# Patient Record
Sex: Female | Born: 1980 | Race: Black or African American | Hispanic: No | Marital: Single | Smoking: Never smoker
Health system: Southern US, Community
[De-identification: ages and names within clinical notes are randomized; demographics above are authoritative.]

## PROBLEM LIST (undated history)

## (undated) DIAGNOSIS — R519 Headache, unspecified: Secondary | ICD-10-CM

## (undated) DIAGNOSIS — D649 Anemia, unspecified: Secondary | ICD-10-CM

## (undated) HISTORY — PX: TONSILLECTOMY AND ADENOIDECTOMY: SHX28

---

## 2014-07-29 ENCOUNTER — Emergency Department (INDEPENDENT_AMBULATORY_CARE_PROVIDER_SITE_OTHER)
Admission: EM | Admit: 2014-07-29 | Discharge: 2014-07-29 | Disposition: A | Discharge status: Home / Self Care | Payer: Self-pay | Source: Home / Self Care

## 2014-07-29 ENCOUNTER — Encounter (HOSPITAL_COMMUNITY): Payer: Self-pay | Admitting: Emergency Medicine

## 2014-07-29 DIAGNOSIS — T2157XA Corrosion of first degree of female genital region, initial encounter: Secondary | ICD-10-CM

## 2014-07-29 DIAGNOSIS — IMO0002 Reserved for concepts with insufficient information to code with codable children: Secondary | ICD-10-CM

## 2014-07-29 HISTORY — DX: Anemia, unspecified: D64.9

## 2014-07-29 MED ORDER — HYDROCORTISONE 1 % EX OINT: 1.0000 "application " | TOPICAL_OINTMENT | Freq: Two times a day (BID) | CUTANEOUS | Status: DC
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
## 2014-07-29 NOTE — ED Notes (Signed)
Patient c/o burn/irritation to her vaginal area onset Monday. Patient reports she used tea  Tree oil mixed with apple cider vinegar on a guaze to treated vaginal odor she often gets before menstruating. Patient reports she left it on for about an hour and immediatly felt burning. Patient is alert and oriented and in NAD.

## 2014-07-29 NOTE — Discharge Instructions (Signed)
Thank you for coming in today. Apply hydrocortisone ointment to the vagina twice daily until the skin feels better.  Come back as needed.   Chemical Burn Many chemicals can burn the skin. A chemical burn should be flushed with cool water and checked by an emergency caregiver. Your skin is a natural barrier to infection. It is the largest organ of your body. Burns damage this natural protection. To help prevent infection, it is very important to follow your caregiver's instructions in the care of your burn.  Many industrial chemicals may cause burns. These chemicals include acids, alkalis, and organic compounds such as petroleum, phenol, bitumen, tar, and grease. When acids come in contact with the skin, they cause an immediate change in the skin.Acid burns produce significant pain and form a scab (eschar). Usually, the immediate skin changes are the only damage from an acid burn.However, exposure to formic acid, chromic acid, or hydrofluoric acid may affect the whole body and may even be life-threatening. Alkalis include lye, cement, lime, and many chemicals with "hydroxide" in their name.An alkali burn may be less apparent than an acid burn at first. However, alkalis may cause greater tissue damage.It is important to be aware of any chemicals you are using. Treat any exposure to skin, eyes, or mucous membranes (nose, mouth, throat) as a potential emergency. PREVENTION  Avoid exposure to toxic chemicals that can cause burns.  Store chemicals out of the reach of children.  Use protective gloves when handling dangerous chemicals. HOME CARE INSTRUCTIONS   Wash your hands well before changing your bandage.  Change your bandage as often as directed by your caregiver.  Remove the old bandage. If the bandage sticks, you may soak it off with cool, clean water.  Cleanse the burn thoroughly but gently with mild soap and water.  Pat the area dry with a clean, dry cloth.  Apply a thin layer of  antibacterial cream to the burn.  Apply a clean bandage as instructed by your caregiver.  Keep the bandage as clean and dry as possible.  Elevate the affected area for the first 24 hours, then as instructed by your caregiver.  Only take over-the-counter or prescription medicines for pain, discomfort, or fever as directed by your caregiver.  Keep all follow-up appointments.This is important. This is how your caregiver can tell if your treatment is working. SEEK IMMEDIATE MEDICAL CARE IF:   You develop excessive pain.  You develop redness, tenderness, swelling, or red streaks near the burn.  The burned area develops yellowish-white fluid (pus) or a bad smell.  You have a fever. MAKE SURE YOU:   Understand these instructions.  Will watch your condition.  Will get help right away if you are not doing well or get worse. Document Released: 08/05/2004 Document Revised: 01/22/2012 Document Reviewed: 03/27/2011 [facility] Patient Information 2015 [place], [place]. This information is not intended to replace advice given to you by your health care provider. Make sure you discuss any questions you have with your health care provider.

## 2015-01-25 ENCOUNTER — Emergency Department (INDEPENDENT_AMBULATORY_CARE_PROVIDER_SITE_OTHER)
Admission: EM | Admit: 2015-01-25 | Discharge: 2015-01-25 | Disposition: A | Discharge status: Home / Self Care | Payer: PRIVATE HEALTH INSURANCE | Source: Home / Self Care | Attending: Family Medicine | Admitting: Family Medicine

## 2015-01-25 ENCOUNTER — Encounter (HOSPITAL_COMMUNITY): Payer: Self-pay

## 2015-01-25 DIAGNOSIS — J069 Acute upper respiratory infection, unspecified: Secondary | ICD-10-CM

## 2015-01-25 MED ORDER — BENZONATATE 100 MG PO CAPS: 100.0000 mg | ORAL_CAPSULE | Freq: Three times a day (TID) | ORAL | Status: DC | PRN

## 2015-01-25 MED ORDER — IPRATROPIUM BROMIDE 0.06 % NA SOLN: 2.0000 | Freq: Four times a day (QID) | NASAL | Status: DC

## 2015-01-25 NOTE — ED Provider Notes (Signed)
CSN: [id number]     Arrival date & time 01/25/15  0940 History   First MD Initiated Contact with Patient 01/25/15 1037     Chief Complaint  Patient presents with  . Facial Pain   (Consider location/radiation/quality/duration/timing/severity/associated sxs/prior Treatment) HPI Comments: No fever Nonsmoker Began taking Sudafed today No flu shot this season PCP: none  Patient is a 34 y.o. female presenting with URI. The history is provided by the patient.  URI Presenting symptoms: congestion and cough   Presenting symptoms: no ear pain, no facial pain, no fatigue, no fever, no rhinorrhea and no sore throat   Severity:  Mild Onset quality:  Gradual Duration:  5 days Timing:  Constant Progression:  Unchanged Chronicity:  New Associated symptoms: headaches   Associated symptoms: no myalgias, no neck pain, no sinus pain, no sneezing, no swollen glands and no wheezing   Associated symptoms comment:  Sinus pressure   Past Medical History  Diagnosis Date  . Anemia    Past Surgical History  Procedure Laterality Date  . Tonsillectomy and adenoidectomy     History reviewed. No pertinent family history. History  Substance Use Topics  . Smoking status: Never Smoker   . Smokeless tobacco: Not on file  . Alcohol Use: Yes   OB History    No data available     Review of Systems  Constitutional: Negative for fever and fatigue.  HENT: Positive for congestion and sinus pressure. Negative for ear pain, hearing loss, mouth sores, nosebleeds, postnasal drip, rhinorrhea, sneezing and sore throat.   Eyes: Negative.   Respiratory: Positive for cough. Negative for chest tightness, shortness of breath and wheezing.   Cardiovascular: Negative.   Gastrointestinal: Negative.   Genitourinary: Negative.   Musculoskeletal: Negative for myalgias, back pain and neck pain.  Skin: Negative.   Neurological: Positive for headaches.    Allergies  Review of patient's allergies indicates no known  allergies.  Home Medications   Prior to Admission medications   Medication Sig Start Date End Date Taking? Authorizing Provider  benzonatate (TESSALON) 100 MG capsule Take 1 capsule (100 mg total) by mouth 3 (three) times daily as needed for cough. 01/25/15   Audelia Hives Kapil Petropoulos, PA  hydrocortisone 1 % ointment Apply 1 application topically 2 (two) times daily. 07/29/14   Gregor Hams, MD  ipratropium (ATROVENT) 0.06 % nasal spray Place 2 sprays into both nostrils 4 (four) times daily. 01/25/15   Audelia Hives Jamani Bearce, PA   BP 119/79 mmHg  Pulse 74  Temp(Src) 98.6 F (37 C) (Oral)  Resp 14  SpO2 96% Physical Exam  Constitutional: She is oriented to person, place, and time. She appears well-developed and well-nourished. No distress.  HENT:  Head: Normocephalic and atraumatic.  Right Ear: Hearing, external ear and ear canal normal. No tenderness. No mastoid tenderness. Tympanic membrane is injected. Tympanic membrane is not erythematous, not retracted and not bulging. No middle ear effusion.  Left Ear: Hearing, external ear and ear canal normal. No tenderness. No mastoid tenderness. Tympanic membrane is not injected, not erythematous, not retracted and not bulging.  No middle ear effusion.  Nose: Mucosal edema present.  Mouth/Throat: Uvula is midline, oropharynx is clear and moist and mucous membranes are normal.  Eyes: Conjunctivae are normal. No scleral icterus.  Neck: Normal range of motion. Neck supple.  Cardiovascular: Normal rate, regular rhythm and normal heart sounds.   Pulmonary/Chest: Effort normal and breath sounds normal. No respiratory distress. She has no wheezes.  Musculoskeletal: Normal range of motion.  Lymphadenopathy:    She has no cervical adenopathy.  Neurological: She is alert and oriented to person, place, and time.  Skin: Skin is warm and dry.  Psychiatric: She has a normal mood and affect. Her behavior is normal.  Nursing note and vitals reviewed.   ED  Course  Procedures (including critical care time) Labs Review Labs Reviewed - No data to display  Imaging Review No results found.   MDM   1. URI (upper respiratory infection)   Atrovent nasal spray Tessalon Continue Sudafed as directed on packaging.  Expect improvement over the next few days    Lutricia Feil, PA 01/25/15 1114

## 2015-01-25 NOTE — ED Notes (Signed)
C/o pain and pressure in face since 3-8. Minimal relief w home treatment. C/o green/yellow secretions

## 2015-01-25 NOTE — Discharge Instructions (Signed)
Please continue using Sudafed as directed on packaging in addition to those medications that have been prescribed here. Drink plenty of fluids. Tylenol or ibuprofen as directed on packaging for pain.  Upper Respiratory Infection, Adult An upper respiratory infection (URI) is also sometimes known as the common cold. The upper respiratory tract includes the nose, sinuses, throat, trachea, and bronchi. Bronchi are the airways leading to the lungs. Most people improve within 1 week, but symptoms can last up to 2 weeks. A residual cough may last even longer.  CAUSES Many different viruses can infect the tissues lining the upper respiratory tract. The tissues become irritated and inflamed and often become very moist. Mucus production is also common. A cold is contagious. You can easily spread the virus to others by oral contact. This includes kissing, sharing a glass, coughing, or sneezing. Touching your mouth or nose and then touching a surface, which is then touched by another person, can also spread the virus. SYMPTOMS  Symptoms typically develop 1 to 3 days after you come in contact with a cold virus. Symptoms vary from person to person. They may include:  Runny nose.  Sneezing.  Nasal congestion.  Sinus irritation.  Sore throat.  Loss of voice (laryngitis).  Cough.  Fatigue.  Muscle aches.  Loss of appetite.  Headache.  Low-grade fever. DIAGNOSIS  You might diagnose your own cold based on familiar symptoms, since most people get a cold 2 to 3 times a year. Your caregiver can confirm this based on your exam. Most importantly, your caregiver can check that your symptoms are not due to another disease such as strep throat, sinusitis, pneumonia, asthma, or epiglottitis. Blood tests, throat tests, and X-rays are not necessary to diagnose a common cold, but they may sometimes be helpful in excluding other more serious diseases. Your caregiver will decide if any further tests are  required. RISKS AND COMPLICATIONS  You may be at risk for a more severe case of the common cold if you smoke cigarettes, have chronic heart disease (such as heart failure) or lung disease (such as asthma), or if you have a weakened immune system. The very young and very old are also at risk for more serious infections. Bacterial sinusitis, middle ear infections, and bacterial pneumonia can complicate the common cold. The common cold can worsen asthma and chronic obstructive pulmonary disease (COPD). Sometimes, these complications can require emergency medical care and may be life-threatening. PREVENTION  The best way to protect against getting a cold is to practice good hygiene. Avoid oral or hand contact with people with cold symptoms. Wash your hands often if contact occurs. There is no clear evidence that vitamin C, vitamin E, echinacea, or exercise reduces the chance of developing a cold. However, it is always recommended to get plenty of rest and practice good nutrition. TREATMENT  Treatment is directed at relieving symptoms. There is no cure. Antibiotics are not effective, because the infection is caused by a virus, not by bacteria. Treatment may include:  Increased fluid intake. Sports drinks offer valuable electrolytes, sugars, and fluids.  Breathing heated mist or steam (vaporizer or shower).  Eating chicken soup or other clear broths, and maintaining good nutrition.  Getting plenty of rest.  Using gargles or lozenges for comfort.  Controlling fevers with ibuprofen or acetaminophen as directed by your caregiver.  Increasing usage of your inhaler if you have asthma. Zinc gel and zinc lozenges, taken in the first 24 hours of the common cold, can shorten the duration  and lessen the severity of symptoms. Pain medicines may help with fever, muscle aches, and throat pain. A variety of non-prescription medicines are available to treat congestion and runny nose. Your caregiver can make  recommendations and may suggest nasal or lung inhalers for other symptoms.  HOME CARE INSTRUCTIONS   Only take over-the-counter or prescription medicines for pain, discomfort, or fever as directed by your caregiver.  Use a warm mist humidifier or inhale steam from a shower to increase air moisture. This may keep secretions moist and make it easier to breathe.  Drink enough water and fluids to keep your urine clear or pale yellow.  Rest as needed.  Return to work when your temperature has returned to normal or as your caregiver advises. You may need to stay home longer to avoid infecting others. You can also use a face mask and careful hand washing to prevent spread of the virus. SEEK MEDICAL CARE IF:   After the first few days, you feel you are getting worse rather than better.  You need your caregiver's advice about medicines to control symptoms.  You develop chills, worsening shortness of breath, or brown or red sputum. These may be signs of pneumonia.  You develop yellow or brown nasal discharge or pain in the face, especially when you bend forward. These may be signs of sinusitis.  You develop a fever, swollen neck glands, pain with swallowing, or white areas in the back of your throat. These may be signs of strep throat. SEEK IMMEDIATE MEDICAL CARE IF:   You have a fever.  You develop severe or persistent headache, ear pain, sinus pain, or chest pain.  You develop wheezing, a prolonged cough, cough up blood, or have a change in your usual mucus (if you have chronic lung disease).  You develop sore muscles or a stiff neck. Document Released: 04/25/2001 Document Revised: 01/22/2012 Document Reviewed: 02/04/2014 [facility] Patient Information 2015 [place], [place]. This information is not intended to replace advice given to you by your health care provider. Make sure you discuss any questions you have with your health care provider.

## 2015-02-09 ENCOUNTER — Other Ambulatory Visit (HOSPITAL_COMMUNITY)
Admission: RE | Admit: 2015-02-09 | Discharge: 2015-02-09 | Disposition: A | Discharge status: Home / Self Care | Payer: PRIVATE HEALTH INSURANCE | Source: Ambulatory Visit | Attending: Nurse Practitioner | Admitting: Nurse Practitioner

## 2015-02-09 ENCOUNTER — Other Ambulatory Visit: Payer: Self-pay | Admitting: Nurse Practitioner

## 2015-02-09 DIAGNOSIS — Z01419 Encounter for gynecological examination (general) (routine) without abnormal findings: Secondary | ICD-10-CM | POA: Diagnosis not present

## 2015-02-09 DIAGNOSIS — Z113 Encounter for screening for infections with a predominantly sexual mode of transmission: Secondary | ICD-10-CM | POA: Diagnosis present

## 2015-02-09 DIAGNOSIS — Z1151 Encounter for screening for human papillomavirus (HPV): Secondary | ICD-10-CM | POA: Insufficient documentation

## 2015-02-11 LAB — CYTOLOGY - PAP

## 2015-03-20 ENCOUNTER — Encounter (HOSPITAL_COMMUNITY): Payer: Self-pay | Admitting: Emergency Medicine

## 2015-03-20 ENCOUNTER — Emergency Department (INDEPENDENT_AMBULATORY_CARE_PROVIDER_SITE_OTHER)
Admission: EM | Admit: 2015-03-20 | Discharge: 2015-03-20 | Disposition: A | Discharge status: Home / Self Care | Payer: PRIVATE HEALTH INSURANCE | Source: Home / Self Care

## 2015-03-20 DIAGNOSIS — T85848A Pain due to other internal prosthetic devices, implants and grafts, initial encounter: Secondary | ICD-10-CM

## 2015-03-20 DIAGNOSIS — T8584XA Pain due to internal prosthetic devices, implants and grafts, not elsewhere classified, initial encounter: Secondary | ICD-10-CM

## 2015-03-20 MED ORDER — DICLOFENAC SODIUM 75 MG PO TBEC: 75.0000 mg | DELAYED_RELEASE_TABLET | Freq: Two times a day (BID) | ORAL | Status: DC

## 2015-03-20 MED ORDER — AMOXICILLIN 875 MG PO TABS: 875.0000 mg | ORAL_TABLET | Freq: Two times a day (BID) | ORAL | Status: DC

## 2015-03-20 NOTE — ED Provider Notes (Signed)
CSN: [id number]     Arrival date & time 03/20/15  1046 History   None    Chief Complaint  Patient presents with  . Dental Pain   (Consider location/radiation/quality/duration/timing/severity/associated sxs/prior Treatment) HPI  3 days ago developed dental pain. Most posterior maxillary mollar. gettign worse. Constant. Denies foul taste or drainage. Worse w/ chewing and hot or cold liquids. Ibuprofen and warm salt water garggling w/o much benefit. Currently no dentist.  Sinus fevers, neck stiffness, difficulty swallowing, no headache, chest pain, palpitations, shortness of breath, palpitations, rash, abdominal pain, dysuria, frequency.  Past Medical History  Diagnosis Date  . Anemia    Past Surgical History  Procedure Laterality Date  . Tonsillectomy and adenoidectomy     Family History  Problem Relation Age of Onset  . Hypertension Mother   . Diabetes Father    History  Substance Use Topics  . Smoking status: Never Smoker   . Smokeless tobacco: Not on file  . Alcohol Use: Yes   OB History    No data available     Review of Systems Per HPI with all other pertinent systems negative.   Allergies  Review of patient's allergies indicates no known allergies.  Home Medications   Prior to Admission medications   Medication Sig Start Date End Date Taking? Authorizing Provider  ferrous fumarate (HEMOCYTE - 106 MG FE) 325 (106 FE) MG TABS tablet Take 1 tablet by mouth.   Yes Historical Provider, MD  amoxicillin (AMOXIL) 875 MG tablet Take 1 tablet (875 mg total) by mouth 2 (two) times daily. 03/20/15   Waldemar Dickens, MD  diclofenac (VOLTAREN) 75 MG EC tablet Take 1 tablet (75 mg total) by mouth 2 (two) times daily. 03/20/15   Waldemar Dickens, MD   BP 108/70 mmHg  Pulse 58  Temp(Src) 98.1 F (36.7 C) (Oral)  Resp 14  SpO2 100%  LMP 03/13/2015 Physical Exam  Physical Exam  Constitutional: oriented to person, place, and time. appears well-developed and well-nourished. No  distress.  HENT:  Head: Normocephalic and atraumatic.  Left third maxillary molar with small cavitary lesion and surrounding periodontal swelling without induration or discharge noted. Breast oropharynx clear. Eyes: EOMI. PERRL.  Neck: Normal range of motion.  Cardiovascular: RRR, no m/r/g, 2+ distal pulses,  Pulmonary/Chest: Effort normal and breath sounds normal. No respiratory distress.  Abdominal: Soft. Bowel sounds are normal. NonTTP, no distension.  Musculoskeletal: Normal range of motion. Non ttp, no effusion.  Neurological: alert and oriented to person, place, and time.  Skin: Skin is warm. No rash noted. non diaphoretic.  Psychiatric: normal mood and affect. behavior is normal. Judgment and thought content normal.    ED Course  Procedures (including critical care time) Labs Review Labs Reviewed - No data to display  Imaging Review No results found.   MDM   1. Dental implant pain, initial encounter    Amoxicillin, Voltaren, daily mouthwash, follow-up with dentist.   Waldemar Dickens, MD 03/20/15 1141

## 2015-03-20 NOTE — Discharge Instructions (Signed)
You have a dental cavity and dental infection. Please use the amoxicillin for the infection. Please use the Voltaren for pain and inflammation. Please use a daily mouthwash. Please call around to find a local dentist to take care of your further dental needs. Best of luck have a great weekend.

## 2015-03-20 NOTE — ED Notes (Signed)
C/o left upper side dental pain onset 3 days Denies fevers, chills Alert, no signs of acute distress.

## 2018-02-05 ENCOUNTER — Other Ambulatory Visit: Payer: Self-pay | Admitting: Nurse Practitioner

## 2018-05-02 ENCOUNTER — Other Ambulatory Visit (HOSPITAL_COMMUNITY)
Admission: RE | Admit: 2018-05-02 | Discharge: 2018-05-02 | Disposition: A | Discharge status: Home / Self Care | Payer: PRIVATE HEALTH INSURANCE | Source: Ambulatory Visit | Attending: Nurse Practitioner | Admitting: Nurse Practitioner

## 2018-05-02 ENCOUNTER — Other Ambulatory Visit: Payer: Self-pay | Admitting: Nurse Practitioner

## 2018-05-02 DIAGNOSIS — Z01419 Encounter for gynecological examination (general) (routine) without abnormal findings: Secondary | ICD-10-CM | POA: Insufficient documentation

## 2018-05-07 LAB — CYTOLOGY - PAP
Chlamydia: NEGATIVE
DIAGNOSIS: NEGATIVE
HPV (WINDOPATH): NOT DETECTED
Neisseria Gonorrhea: NEGATIVE

## 2018-05-13 ENCOUNTER — Ambulatory Visit
Admission: RE | Admit: 2018-05-13 | Discharge: 2018-05-13 | Disposition: A | Discharge status: Home / Self Care | Payer: Self-pay | Source: Ambulatory Visit | Attending: Physician Assistant | Admitting: Physician Assistant

## 2018-05-13 ENCOUNTER — Other Ambulatory Visit: Payer: Self-pay | Admitting: Physician Assistant

## 2018-05-13 DIAGNOSIS — M542 Cervicalgia: Secondary | ICD-10-CM

## 2018-05-13 DIAGNOSIS — M79642 Pain in left hand: Secondary | ICD-10-CM

## 2018-05-13 DIAGNOSIS — M25561 Pain in right knee: Secondary | ICD-10-CM

## 2019-09-14 ENCOUNTER — Other Ambulatory Visit: Payer: Self-pay | Admitting: Nurse Practitioner

## 2019-09-14 DIAGNOSIS — N9069 Other specified hypertrophy of vulva: Secondary | ICD-10-CM | POA: Diagnosis not present

## 2019-09-15 DIAGNOSIS — N920 Excessive and frequent menstruation with regular cycle: Secondary | ICD-10-CM | POA: Diagnosis not present

## 2019-09-15 DIAGNOSIS — D492 Neoplasm of unspecified behavior of bone, soft tissue, and skin: Secondary | ICD-10-CM | POA: Diagnosis not present

## 2019-09-15 DIAGNOSIS — Z01419 Encounter for gynecological examination (general) (routine) without abnormal findings: Secondary | ICD-10-CM | POA: Diagnosis not present

## 2019-09-15 DIAGNOSIS — Z113 Encounter for screening for infections with a predominantly sexual mode of transmission: Secondary | ICD-10-CM | POA: Diagnosis not present

## 2019-09-15 DIAGNOSIS — E559 Vitamin D deficiency, unspecified: Secondary | ICD-10-CM | POA: Diagnosis not present

## 2019-09-15 DIAGNOSIS — Z8742 Personal history of other diseases of the female genital tract: Secondary | ICD-10-CM | POA: Diagnosis not present

## 2019-09-23 ENCOUNTER — Other Ambulatory Visit: Payer: Self-pay | Admitting: Nurse Practitioner

## 2019-09-23 DIAGNOSIS — Z8742 Personal history of other diseases of the female genital tract: Secondary | ICD-10-CM | POA: Diagnosis not present

## 2019-09-23 DIAGNOSIS — Z3202 Encounter for pregnancy test, result negative: Secondary | ICD-10-CM | POA: Diagnosis not present

## 2019-10-01 DIAGNOSIS — Z8742 Personal history of other diseases of the female genital tract: Secondary | ICD-10-CM | POA: Diagnosis not present

## 2019-10-01 DIAGNOSIS — R9389 Abnormal findings on diagnostic imaging of other specified body structures: Secondary | ICD-10-CM | POA: Diagnosis not present

## 2019-10-06 DIAGNOSIS — N939 Abnormal uterine and vaginal bleeding, unspecified: Secondary | ICD-10-CM | POA: Diagnosis not present

## 2020-02-24 ENCOUNTER — Other Ambulatory Visit: Payer: Self-pay | Admitting: Nurse Practitioner

## 2020-02-24 DIAGNOSIS — N950 Postmenopausal bleeding: Secondary | ICD-10-CM | POA: Diagnosis not present

## 2020-02-24 DIAGNOSIS — E559 Vitamin D deficiency, unspecified: Secondary | ICD-10-CM | POA: Diagnosis not present

## 2020-02-24 DIAGNOSIS — D649 Anemia, unspecified: Secondary | ICD-10-CM | POA: Diagnosis not present

## 2020-02-24 DIAGNOSIS — N920 Excessive and frequent menstruation with regular cycle: Secondary | ICD-10-CM | POA: Diagnosis not present

## 2020-02-24 DIAGNOSIS — N840 Polyp of corpus uteri: Secondary | ICD-10-CM | POA: Diagnosis not present

## 2020-03-08 DIAGNOSIS — Z3202 Encounter for pregnancy test, result negative: Secondary | ICD-10-CM | POA: Diagnosis not present

## 2020-03-08 DIAGNOSIS — N840 Polyp of corpus uteri: Secondary | ICD-10-CM | POA: Diagnosis not present

## 2020-03-18 DIAGNOSIS — N939 Abnormal uterine and vaginal bleeding, unspecified: Secondary | ICD-10-CM | POA: Diagnosis not present

## 2020-03-18 DIAGNOSIS — Z3009 Encounter for other general counseling and advice on contraception: Secondary | ICD-10-CM | POA: Diagnosis not present

## 2020-04-29 DIAGNOSIS — D649 Anemia, unspecified: Secondary | ICD-10-CM | POA: Diagnosis not present

## 2020-06-16 DIAGNOSIS — D649 Anemia, unspecified: Secondary | ICD-10-CM | POA: Diagnosis not present

## 2020-08-18 DIAGNOSIS — E559 Vitamin D deficiency, unspecified: Secondary | ICD-10-CM | POA: Diagnosis not present

## 2020-08-18 DIAGNOSIS — D649 Anemia, unspecified: Secondary | ICD-10-CM | POA: Diagnosis not present

## 2020-08-31 ENCOUNTER — Ambulatory Visit (HOSPITAL_COMMUNITY)
Admission: RE | Admit: 2020-08-31 | Discharge: 2020-08-31 | Disposition: A | Discharge status: Home / Self Care | Payer: BC Managed Care – PPO | Source: Ambulatory Visit | Attending: Internal Medicine | Admitting: Internal Medicine

## 2020-08-31 ENCOUNTER — Other Ambulatory Visit: Payer: Self-pay

## 2020-08-31 DIAGNOSIS — N920 Excessive and frequent menstruation with regular cycle: Secondary | ICD-10-CM | POA: Insufficient documentation

## 2020-08-31 DIAGNOSIS — D649 Anemia, unspecified: Secondary | ICD-10-CM | POA: Diagnosis not present

## 2020-08-31 MED ORDER — SODIUM CHLORIDE 0.9 % IV SOLN
750.0000 mg | Freq: Once | INTRAVENOUS | Status: AC
  Administered 2020-08-31: 750 mg via INTRAVENOUS
  Filled 2020-08-31: qty 15

## 2020-08-31 MED ORDER — SODIUM CHLORIDE 0.9 % IV SOLN
INTRAVENOUS | Status: DC | PRN
  Administered 2020-08-31: 250 mL via INTRAVENOUS

## 2020-08-31 NOTE — Progress Notes (Signed)
Patient received IV Injectafer as ordered by Christophe Louis MD. Observed for at least 30 minutes post infusion.Tolerated well, vitals stable, discharge instructions given, verbalized understanding. Patient alert, oriented and ambulatory at the time of discharge.

## 2020-08-31 NOTE — Discharge Instructions (Signed)

## 2020-09-08 ENCOUNTER — Other Ambulatory Visit: Payer: Self-pay

## 2020-09-08 ENCOUNTER — Ambulatory Visit (HOSPITAL_COMMUNITY)
Admission: RE | Admit: 2020-09-08 | Discharge: 2020-09-08 | Disposition: A | Discharge status: Home / Self Care | Payer: BC Managed Care – PPO | Source: Ambulatory Visit | Attending: Internal Medicine | Admitting: Internal Medicine

## 2020-09-08 DIAGNOSIS — D649 Anemia, unspecified: Secondary | ICD-10-CM | POA: Diagnosis not present

## 2020-09-08 DIAGNOSIS — N920 Excessive and frequent menstruation with regular cycle: Secondary | ICD-10-CM | POA: Diagnosis not present

## 2020-09-08 MED ORDER — FERRIC CARBOXYMALTOSE 750 MG/15ML IV SOLN
750.0000 mg | Freq: Once | INTRAVENOUS | Status: AC
  Administered 2020-09-08: 750 mg via INTRAVENOUS
  Filled 2020-09-08: qty 15

## 2020-09-08 MED ORDER — SODIUM CHLORIDE 0.9 % IV SOLN
INTRAVENOUS | Status: DC | PRN
  Administered 2020-09-08: 250 mL via INTRAVENOUS

## 2020-09-08 NOTE — Discharge Instructions (Signed)

## 2021-03-22 DIAGNOSIS — Z01419 Encounter for gynecological examination (general) (routine) without abnormal findings: Secondary | ICD-10-CM | POA: Diagnosis not present

## 2021-03-22 DIAGNOSIS — D509 Iron deficiency anemia, unspecified: Secondary | ICD-10-CM | POA: Diagnosis not present

## 2021-03-22 DIAGNOSIS — E559 Vitamin D deficiency, unspecified: Secondary | ICD-10-CM | POA: Diagnosis not present

## 2021-06-21 DIAGNOSIS — N939 Abnormal uterine and vaginal bleeding, unspecified: Secondary | ICD-10-CM | POA: Diagnosis not present

## 2021-09-20 ENCOUNTER — Other Ambulatory Visit: Payer: Self-pay | Admitting: Physician Assistant

## 2021-09-20 DIAGNOSIS — Z1231 Encounter for screening mammogram for malignant neoplasm of breast: Secondary | ICD-10-CM

## 2021-10-25 ENCOUNTER — Ambulatory Visit
Admission: RE | Admit: 2021-10-25 | Discharge: 2021-10-25 | Disposition: A | Discharge status: Home / Self Care | Payer: BC Managed Care – PPO | Source: Ambulatory Visit | Attending: Physician Assistant | Admitting: Physician Assistant

## 2021-10-25 DIAGNOSIS — Z1231 Encounter for screening mammogram for malignant neoplasm of breast: Secondary | ICD-10-CM

## 2021-10-26 ENCOUNTER — Other Ambulatory Visit: Payer: Self-pay | Admitting: Physician Assistant

## 2021-10-26 DIAGNOSIS — R928 Other abnormal and inconclusive findings on diagnostic imaging of breast: Secondary | ICD-10-CM

## 2021-12-05 ENCOUNTER — Ambulatory Visit
Admission: RE | Admit: 2021-12-05 | Discharge: 2021-12-05 | Disposition: A | Discharge status: Home / Self Care | Payer: BC Managed Care – PPO | Source: Ambulatory Visit | Attending: Physician Assistant | Admitting: Physician Assistant

## 2021-12-05 DIAGNOSIS — R928 Other abnormal and inconclusive findings on diagnostic imaging of breast: Secondary | ICD-10-CM

## 2022-03-27 DIAGNOSIS — Z01419 Encounter for gynecological examination (general) (routine) without abnormal findings: Secondary | ICD-10-CM | POA: Diagnosis not present

## 2022-03-27 DIAGNOSIS — N939 Abnormal uterine and vaginal bleeding, unspecified: Secondary | ICD-10-CM | POA: Diagnosis not present

## 2022-03-27 DIAGNOSIS — N898 Other specified noninflammatory disorders of vagina: Secondary | ICD-10-CM | POA: Diagnosis not present

## 2022-03-27 DIAGNOSIS — Z3202 Encounter for pregnancy test, result negative: Secondary | ICD-10-CM | POA: Diagnosis not present

## 2022-03-29 ENCOUNTER — Telehealth: Payer: Self-pay | Admitting: Hematology and Oncology

## 2022-03-29 NOTE — Telephone Encounter (Signed)
Scheduled appt per 5/16 referral. Pt is aware of appt date and time. Pt is aware to arrive 15 mins prior to appt time and to bring and updated insurance card. Pt is aware of appt location.   

## 2022-04-12 ENCOUNTER — Inpatient Hospital Stay: Payer: BC Managed Care – PPO

## 2022-04-12 ENCOUNTER — Inpatient Hospital Stay: Payer: BC Managed Care – PPO | Attending: Hematology and Oncology | Admitting: Hematology and Oncology

## 2022-04-12 ENCOUNTER — Other Ambulatory Visit: Payer: Self-pay

## 2022-04-12 VITALS — BP 121/77 | HR 73 | Temp 97.9°F | Resp 17 | Ht 71.0 in | Wt 227.7 lb

## 2022-04-12 DIAGNOSIS — N920 Excessive and frequent menstruation with regular cycle: Secondary | ICD-10-CM | POA: Insufficient documentation

## 2022-04-12 DIAGNOSIS — D500 Iron deficiency anemia secondary to blood loss (chronic): Secondary | ICD-10-CM | POA: Insufficient documentation

## 2022-04-12 DIAGNOSIS — F5089 Other specified eating disorder: Secondary | ICD-10-CM | POA: Diagnosis not present

## 2022-04-12 DIAGNOSIS — R0602 Shortness of breath: Secondary | ICD-10-CM | POA: Diagnosis not present

## 2022-04-12 NOTE — Progress Notes (Signed)
[place] [phone]   [phone]  INITIAL CONSULT NOTE  Patient Care Team: Lennie Odor, PA as PCP - General (Nurse Practitioner)  Hematological/Oncological History # Iron Deficiency Anemia 2/2 to GYN Bleeding 03/27/2022: WBC 3.8, Hgb 8.6, MCV 73.2, Plt 283.TIBC 496, Iron sat 4%, ferritin 4.9.   04/12/2022: establish care with Dr. Lorenso Courier  CHIEF COMPLAINTS/PURPOSE OF CONSULTATION:  "Iron Deficiency Anemia "  HISTORY OF PRESENTING ILLNESS:  Rebecca Vance 41 y.o. female with medical history significant for iron deficiency anemia who presents for evaluation.   On review of the previous records Rebecca Vance last had labs collected on 03/27/2022 which showed white blood cell count 3.8, hemoglobin 8.6, MCV 73.2, and platelets of 283.  Iron studies showed an iron sat of 4%, ferritin 4.9, and total iron binding capacity of 496.  Due to concern for these findings patient was referred to hematology for consideration of IV iron therapy.  On exam today Rebecca Vance reports that she has markedly heavy menstrual cycles that go on for approximately 11 days with the heaviest days being 1 to 5 days.  She reports that she goes through about 7-8 pads per day during her heaviest days.  She notes that she has taken over-the-counter iron pills but they upset her stomach.  She notes that she has received IV iron before in the past and has tolerated it quite well.  She notes that she is currently working towards getting a hysterectomy performed but that the OB/GYN doctor wants her hemoglobin "beeped up".  On further discussion she notes that her family history is relatively unremarkable.  She has 2 boys both of whom are healthy.  She notes that she is a never smoker and currently works as a Insurance account manager.  She only rarely drinks alcohol.  She has been having ice cravings and shortness of breath on exertion.  She otherwise denies any fevers, chills, sweats, nausea, vomiting or  diarrhea.  A full 10 point ROS is listed below.  MEDICAL HISTORY:  Past Medical History:  Diagnosis Date   Anemia     SURGICAL HISTORY: Past Surgical History:  Procedure Laterality Date   TONSILLECTOMY AND ADENOIDECTOMY      SOCIAL HISTORY: Social History   Socioeconomic History   Marital status: Single    Spouse name: Not on file   Number of children: Not on file   Years of education: Not on file   Highest education level: Not on file  Occupational History   Not on file  Tobacco Use   Smoking status: Never   Smokeless tobacco: Not on file  Substance and Sexual Activity   Alcohol use: Yes   Drug use: No   Sexual activity: Yes  Other Topics Concern   Not on file  Social History Narrative   Not on file   Social Determinants of Health   Financial Resource Strain: Not on file  Food Insecurity: Not on file  Transportation Needs: Not on file  Physical Activity: Not on file  Stress: Not on file  Social Connections: Not on file  Intimate Partner Violence: Not on file    FAMILY HISTORY: Family History  Problem Relation Age of Onset   Hypertension Mother    Diabetes Father    Breast cancer Neg Hx     ALLERGIES:  is allergic to hydrocodone-acetaminophen.  MEDICATIONS:  Current Outpatient Medications  Medication Sig Dispense Refill   ferrous fumarate (HEMOCYTE - 106 MG FE) 325 (106 FE) MG TABS  tablet Take 1 tablet by mouth.     ibuprofen (ADVIL) 800 MG tablet TAKE 1 TABLET BY MOUTH THREE TIMES DAILY WITH FOOD OR MILK AS NEEDED FOR 5 DAYS OF PERIOD     tranexamic acid (LYSTEDA) 650 MG TABS tablet 2 tablets     No current facility-administered medications for this visit.    REVIEW OF SYSTEMS:   Constitutional: ( - ) fevers, ( - )  chills , ( - ) night sweats Eyes: ( - ) blurriness of vision, ( - ) double vision, ( - ) watery eyes Ears, nose, mouth, throat, and face: ( - ) mucositis, ( - ) sore throat Respiratory: ( - ) cough, ( - ) dyspnea, ( - )  wheezes Cardiovascular: ( - ) palpitation, ( - ) chest discomfort, ( - ) lower extremity swelling Gastrointestinal:  ( - ) nausea, ( - ) heartburn, ( - ) change in bowel habits Skin: ( - ) abnormal skin rashes Lymphatics: ( - ) new lymphadenopathy, ( - ) easy bruising Neurological: ( - ) numbness, ( - ) tingling, ( - ) new weaknesses Behavioral/Psych: ( - ) mood change, ( - ) new changes  All other systems were reviewed with the patient and are negative.  PHYSICAL EXAMINATION:  Vitals:   04/12/22 1407  BP: 121/77  Pulse: 73  Resp: 17  Temp: 97.9 F (36.6 C)  SpO2: 99%   Filed Weights   04/12/22 1407  Weight: 227 lb 11.2 oz (103.3 kg)    GENERAL: well appearing middle-aged African-American female in NAD  SKIN: skin color, texture, turgor are normal, no rashes or significant lesions EYES: conjunctiva are pink and non-injected, sclera clear LUNGS: clear to auscultation and percussion with normal breathing effort HEART: regular rate & rhythm and no murmurs and no lower extremity edema Musculoskeletal: no cyanosis of digits and no clubbing  PSYCH: alert & oriented x 3, fluent speech NEURO: no focal motor/sensory deficits  LABORATORY DATA:  I have reviewed the data as listed     View : No data to display.              View : No data to display.           ASSESSMENT & PLAN Rebecca Vance 41 y.o. female with medical history significant for iron deficiency anemia who presents for evaluation.   After review of the labs, review of the records, and discussion with the patient the patients findings are most consistent with iron deficiency anemia 2/2 to GYN bleeding.   # Iron Deficiency Anemia 2/2 to GYN Bleeding -- Findings are consistent with iron deficiency anemia secondary to patient's menorrhagia --Encouraged her to follow-up with OB/GYN for better control of her menstrual cycles -- All the labs we require were drawn on 03/27/2022.  Those lab results were reviewed and  most consistent with iron deficiency anemia. -- Patient unable to tolerate p.o. iron therapy. --We will plan to proceed with IV iron therapy in order to help bolster the patient's blood counts --Plan for return to clinic in 4 to 6 weeks time after last dose of IV iron  No orders of the defined types were placed in this encounter.   All questions were answered. The patient knows to call the clinic with any problems, questions or concerns.  A total of more than 60 minutes were spent on this encounter with face-to-face time and non-face-to-face time, including preparing to see the patient, ordering tests and/or medications, counseling the  patient and coordination of care as outlined above.   Ledell Peoples, MD Department of Hematology/Oncology [place] at [facility] Phone: [phone] Pager: [phone] Email: [email or web address]  04/16/2022 1:44 PM

## 2022-04-16 ENCOUNTER — Encounter: Payer: Self-pay | Admitting: Hematology and Oncology

## 2022-04-16 DIAGNOSIS — D500 Iron deficiency anemia secondary to blood loss (chronic): Secondary | ICD-10-CM | POA: Insufficient documentation

## 2022-04-17 ENCOUNTER — Telehealth: Payer: Self-pay | Admitting: Pharmacy Technician

## 2022-04-17 ENCOUNTER — Other Ambulatory Visit: Payer: Self-pay | Admitting: Pharmacy Technician

## 2022-04-17 NOTE — Telephone Encounter (Signed)
Dr. Lorenso Courier,  Auth Submission: denied Payer: [employer]BS Medication & CPT/J Code(s) submitted: MONOFERRIC Route of submission (phone, fax, portal): PHONE Auth type: Buy/Bill  Denied due to patient has not tried and or failed step therapy. Venofer Infed Ferrlecit  Would you like to try venofer? Please advise

## 2022-04-18 ENCOUNTER — Ambulatory Visit (INDEPENDENT_AMBULATORY_CARE_PROVIDER_SITE_OTHER): Payer: BC Managed Care – PPO

## 2022-04-18 VITALS — BP 95/62 | HR 62 | Temp 98.0°F | Resp 18 | Ht 71.0 in | Wt 222.0 lb

## 2022-04-18 DIAGNOSIS — D500 Iron deficiency anemia secondary to blood loss (chronic): Secondary | ICD-10-CM

## 2022-04-18 MED ORDER — SODIUM CHLORIDE 0.9 % IV SOLN
200.0000 mg | Freq: Once | INTRAVENOUS | Status: AC
  Administered 2022-04-18: 200 mg via INTRAVENOUS
  Filled 2022-04-18: qty 10

## 2022-04-18 NOTE — Progress Notes (Signed)
Diagnosis: Iron Deficiency Anemia  Provider:  Marshell Garfinkel, MD  Procedure: Infusion  IV Type: Peripheral, IV Location: L Antecubital  Venofer (Iron Sucrose), Dose: 200 mg  Infusion Start Time: 0388  Infusion Stop Time: 1409  Post Infusion IV Care: Observation period completed and Peripheral IV Discontinued  Discharge: Condition: Good, Destination: Home . AVS provided to patient.   Performed by:  Adelina Mings, LPN

## 2022-04-20 ENCOUNTER — Ambulatory Visit (INDEPENDENT_AMBULATORY_CARE_PROVIDER_SITE_OTHER): Payer: BC Managed Care – PPO

## 2022-04-20 VITALS — BP 100/64 | HR 57 | Temp 98.4°F | Resp 18 | Ht 71.0 in | Wt 225.0 lb

## 2022-04-20 DIAGNOSIS — D500 Iron deficiency anemia secondary to blood loss (chronic): Secondary | ICD-10-CM

## 2022-04-20 MED ORDER — SODIUM CHLORIDE 0.9 % IV SOLN
200.0000 mg | Freq: Once | INTRAVENOUS | Status: AC
  Administered 2022-04-20: 200 mg via INTRAVENOUS
  Filled 2022-04-20: qty 10

## 2022-04-20 NOTE — Progress Notes (Signed)
Diagnosis: Iron Deficiency Anemia  Provider:  Marshell Garfinkel, MD  Procedure: Infusion  IV Type: Peripheral, IV Location: L Antecubital  Venofer (Iron Sucrose), Dose: 200 mg  Infusion Start Time: 1216  Infusion Stop Time: 1527  Post Infusion IV Care: Peripheral IV Discontinued  Discharge: Condition: Good, Destination: Home . AVS provided to patient.   Performed by:  Paul Dykes, RN

## 2022-04-24 ENCOUNTER — Ambulatory Visit (INDEPENDENT_AMBULATORY_CARE_PROVIDER_SITE_OTHER): Payer: BC Managed Care – PPO

## 2022-04-24 VITALS — BP 93/60 | HR 60 | Temp 98.0°F | Resp 16 | Ht 71.0 in | Wt 224.0 lb

## 2022-04-24 DIAGNOSIS — D500 Iron deficiency anemia secondary to blood loss (chronic): Secondary | ICD-10-CM

## 2022-04-24 MED ORDER — SODIUM CHLORIDE 0.9 % IV SOLN
200.0000 mg | Freq: Once | INTRAVENOUS | Status: AC
  Administered 2022-04-24: 200 mg via INTRAVENOUS
  Filled 2022-04-24: qty 10

## 2022-04-24 NOTE — Progress Notes (Signed)
Diagnosis: Iron Deficiency Anemia  Provider:  Marshell Garfinkel, MD  Procedure: Infusion  IV Type: Peripheral, IV Location: L Antecubital  Venofer (Iron Sucrose), Dose: 200 mg  Infusion Start Time: 7460  Infusion Stop Time: 0298  Post Infusion IV Care: Peripheral IV Discontinued  Discharge: Condition: Good, Destination: Home . AVS provided to patient.   Performed by:  Adelina Mings, LPN

## 2022-04-24 NOTE — Patient Instructions (Signed)
\  [id number]\

## 2022-04-26 ENCOUNTER — Ambulatory Visit (INDEPENDENT_AMBULATORY_CARE_PROVIDER_SITE_OTHER): Payer: BC Managed Care – PPO

## 2022-04-26 VITALS — BP 93/59 | HR 59 | Temp 97.9°F | Resp 18 | Ht 71.0 in | Wt 226.8 lb

## 2022-04-26 DIAGNOSIS — D500 Iron deficiency anemia secondary to blood loss (chronic): Secondary | ICD-10-CM

## 2022-04-26 MED ORDER — SODIUM CHLORIDE 0.9 % IV SOLN
200.0000 mg | Freq: Once | INTRAVENOUS | Status: AC
  Administered 2022-04-26: 200 mg via INTRAVENOUS
  Filled 2022-04-26: qty 10

## 2022-04-26 NOTE — Progress Notes (Signed)
Diagnosis: Iron Deficiency Anemia  Provider:  Marshell Garfinkel, MD  Procedure: Infusion  IV Type: Peripheral, IV Location: R Antecubital  Venofer (Iron Sucrose), Dose: 200 mg  Infusion Start Time: 3244  Infusion Stop Time: 1510 pm  Post Infusion IV Care: Peripheral IV Discontinued  Discharge: Condition: Good, Destination: Home . AVS provided to patient.   Performed by:  Koren Shiver, RN

## 2022-04-28 ENCOUNTER — Ambulatory Visit (INDEPENDENT_AMBULATORY_CARE_PROVIDER_SITE_OTHER): Payer: BC Managed Care – PPO

## 2022-04-28 VITALS — BP 99/64 | HR 56 | Temp 98.6°F | Resp 16 | Ht 71.0 in | Wt 226.8 lb

## 2022-04-28 DIAGNOSIS — D500 Iron deficiency anemia secondary to blood loss (chronic): Secondary | ICD-10-CM

## 2022-04-28 MED ORDER — SODIUM CHLORIDE 0.9 % IV SOLN
200.0000 mg | Freq: Once | INTRAVENOUS | Status: AC
  Administered 2022-04-28: 200 mg via INTRAVENOUS
  Filled 2022-04-28: qty 10

## 2022-04-28 NOTE — Progress Notes (Signed)
Diagnosis: Iron Deficiency Anemia  Provider:  Marshell Garfinkel, MD  Procedure: Infusion  IV Type: Peripheral, IV Location: R Antecubital  Venofer (Iron Sucrose), Dose: 200 mg  Infusion Start Time: 5747  Infusion Stop Time: 3403  Post Infusion IV Care: Peripheral IV Discontinued  Discharge: Condition: Good, Destination: Home . AVS provided to patient.   Performed by:  Cleophus Molt, RN

## 2022-05-15 DIAGNOSIS — N939 Abnormal uterine and vaginal bleeding, unspecified: Secondary | ICD-10-CM | POA: Diagnosis not present

## 2022-05-15 DIAGNOSIS — Z3202 Encounter for pregnancy test, result negative: Secondary | ICD-10-CM | POA: Diagnosis not present

## 2022-06-09 ENCOUNTER — Inpatient Hospital Stay (HOSPITAL_BASED_OUTPATIENT_CLINIC_OR_DEPARTMENT_OTHER): Payer: BC Managed Care – PPO | Admitting: Hematology and Oncology

## 2022-06-09 ENCOUNTER — Other Ambulatory Visit: Payer: Self-pay

## 2022-06-09 ENCOUNTER — Other Ambulatory Visit: Payer: Self-pay | Admitting: Hematology and Oncology

## 2022-06-09 ENCOUNTER — Inpatient Hospital Stay: Payer: BC Managed Care – PPO | Attending: Hematology and Oncology

## 2022-06-09 VITALS — BP 110/66 | HR 70 | Temp 98.5°F | Resp 15 | Wt 227.1 lb

## 2022-06-09 DIAGNOSIS — N920 Excessive and frequent menstruation with regular cycle: Secondary | ICD-10-CM | POA: Diagnosis not present

## 2022-06-09 DIAGNOSIS — D500 Iron deficiency anemia secondary to blood loss (chronic): Secondary | ICD-10-CM

## 2022-06-09 LAB — CMP (CANCER CENTER ONLY)
ALT: 9 U/L (ref 0–44)
AST: 12 U/L — ABNORMAL LOW (ref 15–41)
Albumin: 4.1 g/dL (ref 3.5–5.0)
Alkaline Phosphatase: 48 U/L (ref 38–126)
Anion gap: 4 — ABNORMAL LOW (ref 5–15)
BUN: 7 mg/dL (ref 6–20)
CO2: 28 mmol/L (ref 22–32)
Calcium: 8.9 mg/dL (ref 8.9–10.3)
Chloride: 106 mmol/L (ref 98–111)
Creatinine: 0.86 mg/dL (ref 0.44–1.00)
GFR, Estimated: >60 mL/min (ref >60)
Glucose, Bld: 119 mg/dL — ABNORMAL HIGH (ref 70–99)
Potassium: 4.1 mmol/L (ref 3.5–5.1)
Sodium: 138 mmol/L (ref 135–145)
Total Bilirubin: 0.4 mg/dL (ref 0.3–1.2)
Total Protein: 7.5 g/dL (ref 6.5–8.1)

## 2022-06-09 LAB — CBC WITH DIFFERENTIAL (CANCER CENTER ONLY)
Abs Immature Granulocytes: 0 10*3/uL (ref 0.00–0.07)
Basophils Absolute: 0 10*3/uL (ref 0.0–0.1)
Basophils Relative: 1 %
Eosinophils Absolute: 0.1 10*3/uL (ref 0.0–0.5)
Eosinophils Relative: 3 %
HCT: 36.3 % (ref 36.0–46.0)
Hemoglobin: 11.8 g/dL — ABNORMAL LOW (ref 12.0–15.0)
Immature Granulocytes: 0 %
Lymphocytes Relative: 37 %
Lymphs Abs: 1.2 10*3/uL (ref 0.7–4.0)
MCH: 27 pg (ref 26.0–34.0)
MCHC: 32.5 g/dL (ref 30.0–36.0)
MCV: 83.1 fL (ref 80.0–100.0)
Monocytes Absolute: 0.3 10*3/uL (ref 0.1–1.0)
Monocytes Relative: 8 %
Neutro Abs: 1.7 10*3/uL (ref 1.7–7.7)
Neutrophils Relative %: 51 %
Platelet Count: 203 10*3/uL (ref 150–400)
RBC: 4.37 MIL/uL (ref 3.87–5.11)
RDW: 19.6 % — ABNORMAL HIGH (ref 11.5–15.5)
WBC Count: 3.2 10*3/uL — ABNORMAL LOW (ref 4.0–10.5)
nRBC: 0 % (ref 0.0–0.2)

## 2022-06-09 LAB — IRON AND IRON BINDING CAPACITY (CC-WL,HP ONLY)
Iron: 57 ug/dL (ref 28–170)
Saturation Ratios: 15 % (ref 10.4–31.8)
TIBC: 370 ug/dL (ref 250–450)
UIBC: 313 ug/dL (ref 148–442)

## 2022-06-09 LAB — RETIC PANEL
Immature Retic Fract: 8.7 % (ref 2.3–15.9)
RBC.: 4.35 MIL/uL (ref 3.87–5.11)
Retic Count, Absolute: 44.4 10*3/uL (ref 19.0–186.0)
Retic Ct Pct: 1 % (ref 0.4–3.1)
Reticulocyte Hemoglobin: 31.1 pg (ref >27.9)

## 2022-06-09 LAB — FERRITIN: Ferritin: 41 ng/mL (ref 11–307)

## 2022-06-09 NOTE — Progress Notes (Signed)
[place] [phone]   [phone]  PROGRESS NOTE  Patient Care Team: Lennie Odor, PA as PCP - General (Nurse Practitioner)  Hematological/Oncological History # Iron Deficiency Anemia 2/2 to GYN Bleeding 03/27/2022: WBC 3.8, Hgb 8.6, MCV 73.2, Plt 283.TIBC 496, Iron sat 4%, ferritin 4.9.   04/12/2022: establish care with Dr. Lorenso Courier 6/6 - 04/28/2022: IV iron sucrose 200 mg x 5 doses.  06/09/2022: WBC 3.2, Hgb 11.8, MCV 83.1, Plt 203  Interval History:  Rebecca Vance 41 y.o. female with medical history significant for iron deficiency anemia secondary to GYN bleeding who presents for a follow up visit. The patient's last visit was on 04/12/2022 at which time she established care. In the interim since the last visit 5 doses of IV iron sucrose from 6/6 to 04/28/2022.   On exam today Rebecca Vance reports that she is working towards getting hysterectomy done.  She reports that she received 5 doses of IV iron and tolerated them quite well without any side effects.  She not have any issues with headache or muscle aches.  She reports that she has not been feeling as tired or groggy in the interim since her last visit.  She does still have some occasional episodes of lightheadedness, dizziness, and shortness of breath.  She also has some occasional numbness of her fingers and toes.  She reports that her menstrual cycles have continued to be quite heavy.  There is no current date scheduled for her hysterectomy.  She reports that her last cycle was currently ongoing.  She notes that her energy levels they are "okay".   She otherwise denies any fevers, chills, sweats, nausea, vomiting or diarrhea.  A full 10 point ROS is listed below.  MEDICAL HISTORY:  Past Medical History:  Diagnosis Date   Anemia     SURGICAL HISTORY: Past Surgical History:  Procedure Laterality Date   TONSILLECTOMY AND ADENOIDECTOMY      SOCIAL HISTORY: Social History   Socioeconomic History    Marital status: Single    Spouse name: Not on file   Number of children: Not on file   Years of education: Not on file   Highest education level: Not on file  Occupational History   Not on file  Tobacco Use   Smoking status: Never   Smokeless tobacco: Not on file  Substance and Sexual Activity   Alcohol use: Yes   Drug use: No   Sexual activity: Yes  Other Topics Concern   Not on file  Social History Narrative   Not on file   Social Determinants of Health   Financial Resource Strain: Not on file  Food Insecurity: Not on file  Transportation Needs: Not on file  Physical Activity: Not on file  Stress: Not on file  Social Connections: Not on file  Intimate Partner Violence: Not on file    FAMILY HISTORY: Family History  Problem Relation Age of Onset   Hypertension Mother    Diabetes Father    Breast cancer Neg Hx     ALLERGIES:  is allergic to hydrocodone-acetaminophen.  MEDICATIONS:  Current Outpatient Medications  Medication Sig Dispense Refill   ferrous fumarate (HEMOCYTE - 106 MG FE) 325 (106 FE) MG TABS tablet Take 1 tablet by mouth.     ibuprofen (ADVIL) 800 MG tablet TAKE 1 TABLET BY MOUTH THREE TIMES DAILY WITH FOOD OR MILK AS NEEDED FOR 5 DAYS OF PERIOD     tranexamic acid (LYSTEDA) 650 MG TABS tablet  2 tablets     No current facility-administered medications for this visit.    REVIEW OF SYSTEMS:   Constitutional: ( - ) fevers, ( - )  chills , ( - ) night sweats Eyes: ( - ) blurriness of vision, ( - ) double vision, ( - ) watery eyes Ears, nose, mouth, throat, and face: ( - ) mucositis, ( - ) sore throat Respiratory: ( - ) cough, ( - ) dyspnea, ( - ) wheezes Cardiovascular: ( - ) palpitation, ( - ) chest discomfort, ( - ) lower extremity swelling Gastrointestinal:  ( - ) nausea, ( - ) heartburn, ( - ) change in bowel habits Skin: ( - ) abnormal skin rashes Lymphatics: ( - ) new lymphadenopathy, ( - ) easy bruising Neurological: ( - ) numbness, ( - )  tingling, ( - ) new weaknesses Behavioral/Psych: ( - ) mood change, ( - ) new changes  All other systems were reviewed with the patient and are negative.  PHYSICAL EXAMINATION:  Vitals:   06/09/22 1124  BP: 110/66  Pulse: 70  Resp: 15  Temp: 98.5 F (36.9 C)  SpO2: 99%   Filed Weights   06/09/22 1124  Weight: 227 lb 1.6 oz (103 kg)    GENERAL: Well-appearing middle-aged African-American female, alert, no distress and comfortable SKIN: skin color, texture, turgor are normal, no rashes or significant lesions EYES: conjunctiva are pink and non-injected, sclera clear LUNGS: clear to auscultation and percussion with normal breathing effort HEART: regular rate & rhythm and no murmurs and no lower extremity edema Musculoskeletal: no cyanosis of digits and no clubbing  PSYCH: alert & oriented x 3, fluent speech NEURO: no focal motor/sensory deficits  LABORATORY DATA:  I have reviewed the data as listed    Latest Ref Rng & Units 06/09/2022   10:38 AM  CBC  WBC 4.0 - 10.5 K/uL 3.2   Hemoglobin 12.0 - 15.0 g/dL 11.8   Hematocrit 36.0 - 46.0 % 36.3   Platelets 150 - 400 K/uL 203        Latest Ref Rng & Units 06/09/2022   10:38 AM  CMP  Glucose 70 - 99 mg/dL 119   BUN 6 - 20 mg/dL 7   Creatinine 0.44 - 1.00 mg/dL 0.86   Sodium 135 - 145 mmol/L 138   Potassium 3.5 - 5.1 mmol/L 4.1   Chloride 98 - 111 mmol/L 106   CO2 22 - 32 mmol/L 28   Calcium 8.9 - 10.3 mg/dL 8.9   Total Protein 6.5 - 8.1 g/dL 7.5   Total Bilirubin 0.3 - 1.2 mg/dL 0.4   Alkaline Phos 38 - 126 U/L 48   AST 15 - 41 U/L 12   ALT 0 - 44 U/L 9     RADIOGRAPHIC STUDIES: No results found.  ASSESSMENT & PLAN Rebecca Vance 41 y.o. female with medical history significant for iron deficiency anemia secondary to GYN bleeding who presents for a follow up visit.  # Iron Deficiency Anemia 2/2 to GYN Bleeding -- Findings are consistent with iron deficiency anemia secondary to patient's menorrhagia --Encouraged  her to follow-up with OB/GYN for better control of her menstrual cycles.  Currently working towards having a hysterectomy performed.  Hysterectomy not yet scheduled. -- Labs today show WBC 3.2, Hgb 11.8, MCV 83.1, Plt 203 -- Patient unable to tolerate p.o. iron therapy. -- Iron levels today currently pending.  If repeat dosing is required we will have this arranged --Plan to have the  patient return to clinic in 3 months time.  Orders Placed This Encounter  Procedures   CBC with Differential ([place] Only)    Standing Status:   Standing    Number of Occurrences:   4    Standing Expiration Date:   06/11/2023   CMP ([place] only)    Standing Status:   Standing    Number of Occurrences:   4    Standing Expiration Date:   06/11/2023   Ferritin    Standing Status:   Standing    Number of Occurrences:   4    Standing Expiration Date:   06/11/2023   Iron and Iron Binding Capacity ([facility] only)    Standing Status:   Standing    Number of Occurrences:   4    Standing Expiration Date:   06/11/2023   Retic Panel    Standing Status:   Standing    Number of Occurrences:   4    Standing Expiration Date:   06/11/2023    All questions were answered. The patient knows to call the clinic with any problems, questions or concerns.  A total of more than 30 minutes were spent on this encounter with face-to-face time and non-face-to-face time, including preparing to see the patient, ordering tests and/or medications, counseling the patient and coordination of care as outlined above.   Ledell Peoples, MD Department of Hematology/Oncology [place] at [facility] Phone: [phone] Pager: [phone] Email: [email or web address]  06/10/2022 6:38 PM

## 2022-06-10 ENCOUNTER — Encounter: Payer: Self-pay | Admitting: Hematology and Oncology

## 2022-06-13 DIAGNOSIS — Z23 Encounter for immunization: Secondary | ICD-10-CM | POA: Diagnosis not present

## 2022-06-13 DIAGNOSIS — Z0000 Encounter for general adult medical examination without abnormal findings: Secondary | ICD-10-CM | POA: Diagnosis not present

## 2022-06-13 DIAGNOSIS — Z13220 Encounter for screening for lipoid disorders: Secondary | ICD-10-CM | POA: Diagnosis not present

## 2022-07-28 DIAGNOSIS — U071 COVID-19: Secondary | ICD-10-CM

## 2022-07-28 HISTORY — DX: COVID-19: U07.1

## 2022-09-09 DIAGNOSIS — N939 Abnormal uterine and vaginal bleeding, unspecified: Secondary | ICD-10-CM

## 2022-09-09 NOTE — H&P (Signed)
Rebecca Vance is an 41 y.o. G2P2 who is admitted for Total Vaginal Hysterectomy with bilateral salpingectomy for AUB.  Patient has a long-standing history of AUB which has caused iron deficiency anemia. Her periods are occurring every 18 days and they are very heavy. She has had a previous work-up in 2021 for AUB which included an EMB in 2020 and 2021 (both benign) and SHG (which revealed likely endometrial polyp). She was previously offered Hysteroscopy D&C with HTA ablation but never proceeded as she was not sure if the procedure would be successful.  Patient has an IUD expulse in the past which was incredibly painful. She has tried OCPs and Nuvaring without relief of heavy menses as well. She has used Lysteda without relief of her heavy menses. She declines any further trial with medical management. Desires definitive hysterectomy. Patient does not wish for future child-bearing.      Patient follows with [facility] for iron deficiency anemia and receives iron infusions.  Work-up: EMB (05/15/2022): Scant weakly proliferative endometrium with abundant hemorrhage. EMB (02/25/2020): Secretory endometrium with foci of breakdown, no hyperplasia or malignancy identified EMB (09/25/2019): Secretory endometrium with breakdown, no hyperplasia or malignancy  Pap smear (05/02/2018): NILM/HRHPV negative  CBC (06/09/2022)): WBC 3.2  Hgb 11.8  Hct 36.3  Plt 203  TSH (2020): 3.70 (wnl)  TVUS (05/15/2022): Uterus 9.55 x 6.33 x 5.08cm Endometrium 0.715cm Right ovary 2.93cm  Left ovary 3.08cm Anteverted uterus. Bilateral ovaries appear normal. Uterine fibroid - anterior 1.5 x 1.3 x 0.8cm, stable in size from previous US 02/24/2020. Endometrium thickened, no blood flow noted. No adnexal masses seen.   Patient Active Problem List   Diagnosis Date Noted   Abnormal uterine bleeding (AUB) 09/09/2022   Iron deficiency anemia due to chronic blood loss 04/16/2022    MEDICAL/FAMILY/SOCIAL HX: No LMP  recorded.    Past Medical History:  Diagnosis Date   Anemia     Past Surgical History:  Procedure Laterality Date   TONSILLECTOMY AND ADENOIDECTOMY      Family History  Problem Relation Age of Onset   Hypertension Mother    Diabetes Father    Breast cancer Neg Hx     Social History:  reports that she has never smoked. She does not have any smokeless tobacco history on file. She reports current alcohol use. She reports that she does not use drugs.  ALLERGIES/MEDS:  Allergies:  Allergies  Allergen Reactions   Hydrocodone-Acetaminophen Other (See Comments)    No medications prior to admission.     Review of Systems  Constitutional: Negative.   HENT: Negative.    Eyes: Negative.   Respiratory: Negative.    Cardiovascular: Negative.   Gastrointestinal: Negative.   Genitourinary: Negative.   Musculoskeletal: Negative.   Skin: Negative.   Neurological: Negative.   Endo/Heme/Allergies: Negative.   Psychiatric/Behavioral: Negative.      There were no vitals taken for this visit. Gen:  NAD, pleasant and cooperative Cardio:  RRR Pulm:  CTAB, no wheezes/rales/rhonchi Abd:  Soft, non-distended, non-tender throughout, no rebound/guarding Ext:  No bilateral LE edema, no bilateral calf tenderness Pelvic: Labia - unremarkable, vagina - pink moist mucosa, no lesions or abnormal discharge, cervix - no discharge or lesions or CMT, adnexa - no masses or tenderness - uterus non-tender and normal size on palpation   No results found for this or any previous visit (from the past 24 hour(s)).  No results found.   ASSESSMENT/PLAN: Rebecca Vance is a 41 y.o. G2P2 admitted for  Total Vaginal Hysterectomy with bilateral salpingectomy for AUB.  - Admit to [facility] - Admit labs (CBC, T&S, COVID screen per protocol, urine HCG) - Diet:  Per anesthesia - IVF:  Per anesthesia - VTE Prophylaxis:  SCDs - Antibiotics: Ancef 2g on call to OR - D/C home same-day or possibly  POD#1  Consents: I have explained to the patient that this surgery is performed to remove the uterus through an incision in the vagina and that it will result in sterility.  I discussed the risks and benefits of the surgery, including, but not limited to bleeding, including the need for a blood transfusion, infection, damage to surrounding organs and tissues, damage to bladder, damage to ureters, causing kidney damage, and requiring additional procedures, damage to bowels, resulting in further surgery, postoperative pain, short-term and long-term, scarring intra-abdominally, need for an open procedure, need for further surgery, deep vein thrombosis and/or pulmonary embolism, wound infection and/or separation, painful intercourse, urinary leakage, ovarian failure, resulting in menopausal symptoms requiring treatment, fistula formation, complications the course of which cannot be predicted or prevented, and death. Patient was consented for blood products.  The patient is aware that bleeding may result in the need for a blood transfusion which includes risk of transmission of HIV (1:2 million), Hepatitis C (1:2 million), and Hepatitis B (1:200 thousand) and transfusion reaction.  Patient voiced understanding of the above risks as well as understanding of indications for blood transfusion.   Drema Dallas, DO

## 2022-09-11 ENCOUNTER — Other Ambulatory Visit: Payer: Self-pay

## 2022-09-11 ENCOUNTER — Inpatient Hospital Stay: Payer: BC Managed Care – PPO | Attending: Hematology and Oncology

## 2022-09-11 DIAGNOSIS — D500 Iron deficiency anemia secondary to blood loss (chronic): Secondary | ICD-10-CM | POA: Diagnosis not present

## 2022-09-11 LAB — CBC WITH DIFFERENTIAL (CANCER CENTER ONLY)
Abs Immature Granulocytes: 0.01 10*3/uL (ref 0.00–0.07)
Basophils Absolute: 0 10*3/uL (ref 0.0–0.1)
Basophils Relative: 1 %
Eosinophils Absolute: 0.1 10*3/uL (ref 0.0–0.5)
Eosinophils Relative: 2 %
HCT: 37.3 % (ref 36.0–46.0)
Hemoglobin: 12.4 g/dL (ref 12.0–15.0)
Immature Granulocytes: 0 %
Lymphocytes Relative: 36 %
Lymphs Abs: 1.6 10*3/uL (ref 0.7–4.0)
MCH: 28.4 pg (ref 26.0–34.0)
MCHC: 33.2 g/dL (ref 30.0–36.0)
MCV: 85.6 fL (ref 80.0–100.0)
Monocytes Absolute: 0.4 10*3/uL (ref 0.1–1.0)
Monocytes Relative: 8 %
Neutro Abs: 2.3 10*3/uL (ref 1.7–7.7)
Neutrophils Relative %: 53 %
Platelet Count: 230 10*3/uL (ref 150–400)
RBC: 4.36 MIL/uL (ref 3.87–5.11)
RDW: 13.6 % (ref 11.5–15.5)
WBC Count: 4.3 10*3/uL (ref 4.0–10.5)
nRBC: 0 % (ref 0.0–0.2)

## 2022-09-11 LAB — CMP (CANCER CENTER ONLY)
ALT: 10 U/L (ref 0–44)
AST: 12 U/L — ABNORMAL LOW (ref 15–41)
Albumin: 4.3 g/dL (ref 3.5–5.0)
Alkaline Phosphatase: 53 U/L (ref 38–126)
Anion gap: 6 (ref 5–15)
BUN: 11 mg/dL (ref 6–20)
CO2: 24 mmol/L (ref 22–32)
Calcium: 8.9 mg/dL (ref 8.9–10.3)
Chloride: 106 mmol/L (ref 98–111)
Creatinine: 0.8 mg/dL (ref 0.44–1.00)
GFR, Estimated: >60 mL/min (ref >60)
Glucose, Bld: 92 mg/dL (ref 70–99)
Potassium: 4.1 mmol/L (ref 3.5–5.1)
Sodium: 136 mmol/L (ref 135–145)
Total Bilirubin: 0.4 mg/dL (ref 0.3–1.2)
Total Protein: 8.1 g/dL (ref 6.5–8.1)

## 2022-09-11 LAB — FERRITIN: Ferritin: 13 ng/mL (ref 11–307)

## 2022-09-11 LAB — RETIC PANEL
Immature Retic Fract: 19 % — ABNORMAL HIGH (ref 2.3–15.9)
RBC.: 4.35 MIL/uL (ref 3.87–5.11)
Retic Count, Absolute: 67.4 10*3/uL (ref 19.0–186.0)
Retic Ct Pct: 1.6 % (ref 0.4–3.1)
Reticulocyte Hemoglobin: 28.7 pg (ref >27.9)

## 2022-09-11 LAB — IRON AND IRON BINDING CAPACITY (CC-WL,HP ONLY)
Iron: 280 ug/dL — ABNORMAL HIGH (ref 28–170)
Saturation Ratios: 68 % — ABNORMAL HIGH (ref 10.4–31.8)
TIBC: 414 ug/dL (ref 250–450)
UIBC: 134 ug/dL — ABNORMAL LOW (ref 148–442)

## 2022-09-14 DIAGNOSIS — N939 Abnormal uterine and vaginal bleeding, unspecified: Secondary | ICD-10-CM | POA: Diagnosis not present

## 2022-09-14 DIAGNOSIS — Z01818 Encounter for other preprocedural examination: Secondary | ICD-10-CM | POA: Diagnosis not present

## 2022-09-18 ENCOUNTER — Encounter (HOSPITAL_BASED_OUTPATIENT_CLINIC_OR_DEPARTMENT_OTHER): Payer: Self-pay | Admitting: Obstetrics and Gynecology

## 2022-09-18 ENCOUNTER — Other Ambulatory Visit: Payer: Self-pay

## 2022-09-18 NOTE — Progress Notes (Signed)
Your procedure is scheduled on Wednesday, 09/27/2022.  Report to [facility]   Call this number if you have problems the morning of surgery  [phone].   OUR ADDRESS IS [address].  WE ARE LOCATED IN [facility].  PLEASE BRING YOUR INSURANCE CARD AND PHOTO ID DAY OF SURGERY.  ONLY 2 PEOPLE ARE ALLOWED IN  WAITING  ROOM.                                      REMEMBER:  DO NOT EAT FOOD, CANDY GUM OR MINTS  AFTER MIDNIGHT THE NIGHT BEFORE YOUR SURGERY . YOU MAY HAVE CLEAR LIQUIDS FROM MIDNIGHT THE NIGHT BEFORE YOUR SURGERY UNTIL  9:45 AM. NO CLEAR LIQUIDS AFTER   9:45 AM DAY OF SURGERY.  YOU MAY  BRUSH YOUR TEETH MORNING OF SURGERY AND RINSE YOUR MOUTH OUT, NO CHEWING GUM CANDY OR MINTS.     CLEAR LIQUID DIET   Foods Allowed                                                                     Foods Excluded  Coffee and tea, regular and decaf                             liquids that you cannot  Plain Jell-O                                                                   see through such as: Fruit ices (not with fruit pulp)                                     milk, soups, orange juice  Plain  Popsicles                                    All solid food Carbonated beverages, regular and diet                                    Cranberry, grape and apple juices Sports drinks like Gatorade _____________________________________________________________________     TAKE ONLY THESE MEDICATIONS MORNING OF SURGERY: NONE    UP TO 4 VISITORS  MAY VISIT IN THE EXTENDED RECOVERY ROOM UNTIL 800 PM ONLY.  ONE  VISITOR AGE 18 AND OVER MAY SPEND THE NIGHT AND MUST BE IN EXTENDED RECOVERY ROOM NO LATER THAN 800 PM . YOUR DISCHARGE TIME AFTER YOU SPEND THE NIGHT IS 900 AM THE MORNING AFTER YOUR SURGERY.  YOU MAY PACK A SMALL OVERNIGHT BAG WITH TOILETRIES FOR YOUR OVERNIGHT STAY  IF YOU WISH.  YOUR PRESCRIPTION MEDICATIONS WILL BE  PROVIDED DURING [place].                                      DO NOT WEAR JEWERLY, MAKE UP. DO NOT WEAR LOTIONS, POWDERS, PERFUMES OR NAIL POLISH ON YOUR FINGERNAILS. TOENAIL POLISH IS OK TO WEAR. DO NOT SHAVE FOR 48 HOURS PRIOR TO DAY OF SURGERY. MEN MAY SHAVE FACE AND NECK. CONTACTS, GLASSES, OR DENTURES MAY NOT BE WORN TO SURGERY.  REMEMBER: NO SMOKING, DRUGS OR ALCOHOL FOR 24 HOURS BEFORE YOUR SURGERY.                                    [facility] IS NOT RESPONSIBLE  FOR ANY BELONGINGS.                                                                    Marland Kitchen           [facility] - Preparing for Surgery Before surgery, you can play an important role.  Because skin is not sterile, your skin needs to be as free of germs as possible.  You can reduce the number of germs on your skin by washing with CHG (chlorahexidine gluconate) soap before surgery.  CHG is an antiseptic cleaner which kills germs and bonds with the skin to continue killing germs even after washing. Please DO NOT use if you have an allergy to CHG or antibacterial soaps.  If your skin becomes reddened/irritated stop using the CHG and inform your nurse when you arrive at Short Stay. Do not shave (including legs and underarms) for at least 48 hours prior to the first CHG shower.  You may shave your face/neck. Please follow these instructions carefully:  1.  Shower with CHG Soap the night before surgery and the  morning of Surgery.  2.  If you choose to wash your hair, wash your hair first as usual with your  normal  shampoo.  3.  After you shampoo, rinse your hair and body thoroughly to remove the  shampoo.                                        4.  Use CHG as you would any other liquid soap.  You can apply chg directly  to the skin and wash , chg soap provided, night before and morning of your surgery.  5.  Apply the CHG Soap to your body ONLY FROM THE NECK DOWN.   Do not use on face/ open                           Wound  or open sores. Avoid contact with eyes, ears mouth and genitals (private parts).                       Wash face,  Genitals (private parts) with your normal soap.  6.  Wash thoroughly, paying special attention to the area where your surgery  will be performed.  7.  Thoroughly rinse your body with warm water from the neck down.  8.  DO NOT shower/wash with your normal soap after using and rinsing off  the CHG Soap.             9.  Pat yourself dry with a clean towel.            10.  Wear clean pajamas.            11.  Place clean sheets on your bed the night of your first shower and do not  sleep with pets. Day of Surgery : Do not apply any lotions/deodorants the morning of surgery.  Please wear clean clothes to the hospital/surgery center.  IF YOU HAVE ANY SKIN IRRITATION OR PROBLEMS WITH THE SURGICAL SOAP, PLEASE GET A BAR OF GOLD DIAL SOAP AND SHOWER THE NIGHT BEFORE YOUR SURGERY AND THE MORNING OF YOUR SURGERY. PLEASE LET THE NURSE KNOW MORNING OF YOUR SURGERY IF YOU HAD ANY PROBLEMS WITH THE SURGICAL SOAP.   ________________________________________________________________________                                                        QUESTIONS [facility] Falling PRE OP NURSE PHONE [phone].

## 2022-09-18 NOTE — Progress Notes (Signed)
Spoke w/ via phone for pre-op interview---[facility] needs dos----  urine pregnancy             Lab results------09/22/22 lab appt for cbc, type & screen, 09/11/22 labwork in [place] test -----patient states asymptomatic no test needed Arrive at -------1045 on 09/27/2022 NPO after MN NO Solid Food.  Clear liquids from MN until---0945 Med rec completed Medications to take morning of surgery -----none Diabetic medication -----n/a Patient instructed no nail polish to be worn day of surgery Patient instructed to bring photo id and insurance card day of surgery Patient aware to have Driver (ride ) / caregiver    for 24 hours after surgery - Legrand Como or Geni Bers Patient Special Instructions -----Extended / overnight stay instructions given. Pre-Op special Istructions -----none Patient verbalized understanding of instructions that were given at this phone interview. Patient denies shortness of breath, chest pain, fever, cough at this phone interview.

## 2022-09-22 ENCOUNTER — Encounter (HOSPITAL_COMMUNITY)
Admission: RE | Admit: 2022-09-22 | Discharge: 2022-09-22 | Disposition: A | Discharge status: Home / Self Care | Payer: BC Managed Care – PPO | Source: Ambulatory Visit | Attending: Obstetrics and Gynecology | Admitting: Obstetrics and Gynecology

## 2022-09-22 DIAGNOSIS — D500 Iron deficiency anemia secondary to blood loss (chronic): Secondary | ICD-10-CM | POA: Diagnosis not present

## 2022-09-22 DIAGNOSIS — N939 Abnormal uterine and vaginal bleeding, unspecified: Secondary | ICD-10-CM | POA: Diagnosis not present

## 2022-09-22 DIAGNOSIS — Z01818 Encounter for other preprocedural examination: Secondary | ICD-10-CM | POA: Diagnosis not present

## 2022-09-22 LAB — CBC
HCT: 36.5 % (ref 36.0–46.0)
Hemoglobin: 11.4 g/dL — ABNORMAL LOW (ref 12.0–15.0)
MCH: 27.7 pg (ref 26.0–34.0)
MCHC: 31.2 g/dL (ref 30.0–36.0)
MCV: 88.8 fL (ref 80.0–100.0)
Platelets: 224 10*3/uL (ref 150–400)
RBC: 4.11 MIL/uL (ref 3.87–5.11)
RDW: 13.3 % (ref 11.5–15.5)
WBC: 3.8 10*3/uL — ABNORMAL LOW (ref 4.0–10.5)
nRBC: 0 % (ref 0.0–0.2)

## 2022-09-27 ENCOUNTER — Ambulatory Visit (HOSPITAL_BASED_OUTPATIENT_CLINIC_OR_DEPARTMENT_OTHER): Payer: BC Managed Care – PPO | Admitting: Anesthesiology

## 2022-09-27 ENCOUNTER — Other Ambulatory Visit: Payer: Self-pay

## 2022-09-27 ENCOUNTER — Encounter (HOSPITAL_BASED_OUTPATIENT_CLINIC_OR_DEPARTMENT_OTHER): Payer: Self-pay | Admitting: Obstetrics and Gynecology

## 2022-09-27 ENCOUNTER — Encounter (HOSPITAL_BASED_OUTPATIENT_CLINIC_OR_DEPARTMENT_OTHER)
Admission: RE | Disposition: A | Discharge status: Home / Self Care | Payer: Self-pay | Source: Ambulatory Visit | Attending: Obstetrics and Gynecology

## 2022-09-27 ENCOUNTER — Ambulatory Visit (HOSPITAL_BASED_OUTPATIENT_CLINIC_OR_DEPARTMENT_OTHER)
Admission: RE | Admit: 2022-09-27 | Discharge: 2022-09-28 | Disposition: A | Discharge status: Home / Self Care | Payer: BC Managed Care – PPO | Source: Ambulatory Visit | Attending: Obstetrics and Gynecology | Admitting: Obstetrics and Gynecology

## 2022-09-27 ENCOUNTER — Other Ambulatory Visit (HOSPITAL_COMMUNITY): Payer: Self-pay

## 2022-09-27 DIAGNOSIS — N879 Dysplasia of cervix uteri, unspecified: Secondary | ICD-10-CM | POA: Diagnosis not present

## 2022-09-27 DIAGNOSIS — D509 Iron deficiency anemia, unspecified: Secondary | ICD-10-CM | POA: Diagnosis not present

## 2022-09-27 DIAGNOSIS — N939 Abnormal uterine and vaginal bleeding, unspecified: Secondary | ICD-10-CM | POA: Insufficient documentation

## 2022-09-27 DIAGNOSIS — D251 Intramural leiomyoma of uterus: Secondary | ICD-10-CM | POA: Insufficient documentation

## 2022-09-27 DIAGNOSIS — N840 Polyp of corpus uteri: Secondary | ICD-10-CM | POA: Diagnosis not present

## 2022-09-27 DIAGNOSIS — Z01818 Encounter for other preprocedural examination: Secondary | ICD-10-CM

## 2022-09-27 DIAGNOSIS — N72 Inflammatory disease of cervix uteri: Secondary | ICD-10-CM | POA: Insufficient documentation

## 2022-09-27 DIAGNOSIS — D500 Iron deficiency anemia secondary to blood loss (chronic): Secondary | ICD-10-CM

## 2022-09-27 HISTORY — DX: Headache, unspecified: R51.9

## 2022-09-27 HISTORY — PX: VAGINAL HYSTERECTOMY: SHX2639

## 2022-09-27 LAB — POCT PREGNANCY, URINE: Preg Test, Ur: NEGATIVE

## 2022-09-27 LAB — ABO/RH: ABO/RH(D): O POS

## 2022-09-27 LAB — TYPE AND SCREEN
ABO/RH(D): O POS
Antibody Screen: NEGATIVE

## 2022-09-27 SURGERY — HYSTERECTOMY, VAGINAL
Anesthesia: General | Site: Vagina | Laterality: Bilateral

## 2022-09-27 MED ORDER — ONDANSETRON HCL 4 MG/2ML IJ SOLN
4.0000 mg | Freq: Four times a day (QID) | INTRAMUSCULAR | Status: DC | PRN
  Administered 2022-09-27 – 2022-09-28 (×2): 4 mg via INTRAVENOUS

## 2022-09-27 MED ORDER — MORPHINE SULFATE (PF) 4 MG/ML IV SOLN: 1.0000 mg | INTRAVENOUS | Status: DC | PRN

## 2022-09-27 MED ORDER — SIMETHICONE 80 MG PO CHEW: 80.0000 mg | CHEWABLE_TABLET | Freq: Four times a day (QID) | ORAL | Status: DC | PRN

## 2022-09-27 MED ORDER — OXYCODONE HCL 5 MG PO TABS
5.0000 mg | ORAL_TABLET | Freq: Four times a day (QID) | ORAL | 0 refills | Status: AC | PRN
  Filled 2022-09-27: qty 20, 5d supply, fill #0

## 2022-09-27 MED ORDER — ACETAMINOPHEN 500 MG PO TABS
ORAL_TABLET | ORAL | Status: AC
  Filled 2022-09-27: qty 2

## 2022-09-27 MED ORDER — FENTANYL CITRATE (PF) 100 MCG/2ML IJ SOLN
INTRAMUSCULAR | Status: DC | PRN
  Administered 2022-09-27 (×4): 50 ug via INTRAVENOUS

## 2022-09-27 MED ORDER — DEXAMETHASONE SODIUM PHOSPHATE 4 MG/ML IJ SOLN
INTRAMUSCULAR | Status: DC | PRN
  Administered 2022-09-27: 10 mg via INTRAVENOUS

## 2022-09-27 MED ORDER — LIDOCAINE-EPINEPHRINE 1 %-1:100000 IJ SOLN
INTRAMUSCULAR | Status: DC | PRN
  Administered 2022-09-27: 20 mL

## 2022-09-27 MED ORDER — OXYCODONE HCL 5 MG PO TABS
5.0000 mg | ORAL_TABLET | Freq: Once | ORAL | Status: AC | PRN
  Administered 2022-09-27: 5 mg via ORAL

## 2022-09-27 MED ORDER — LIDOCAINE HCL (CARDIAC) PF 100 MG/5ML IV SOSY
PREFILLED_SYRINGE | INTRAVENOUS | Status: DC | PRN
  Administered 2022-09-27: 80 mg via INTRAVENOUS

## 2022-09-27 MED ORDER — DEXAMETHASONE SODIUM PHOSPHATE 10 MG/ML IJ SOLN
INTRAMUSCULAR | Status: AC
  Filled 2022-09-27: qty 1

## 2022-09-27 MED ORDER — OXYCODONE HCL 5 MG PO TABS
ORAL_TABLET | ORAL | Status: AC
  Filled 2022-09-27: qty 1

## 2022-09-27 MED ORDER — ROCURONIUM BROMIDE 100 MG/10ML IV SOLN
INTRAVENOUS | Status: DC | PRN
  Administered 2022-09-27: 10 mg via INTRAVENOUS
  Administered 2022-09-27: 70 mg via INTRAVENOUS

## 2022-09-27 MED ORDER — AMISULPRIDE (ANTIEMETIC) 5 MG/2ML IV SOLN: 10.0000 mg | Freq: Once | INTRAVENOUS | Status: DC | PRN

## 2022-09-27 MED ORDER — ONDANSETRON HCL 4 MG/2ML IJ SOLN
INTRAMUSCULAR | Status: AC
  Filled 2022-09-27: qty 2

## 2022-09-27 MED ORDER — MENTHOL 3 MG MT LOZG: 1.0000 | LOZENGE | OROMUCOSAL | Status: DC | PRN

## 2022-09-27 MED ORDER — LIDOCAINE-EPINEPHRINE 1 %-1:100000 IJ SOLN
INTRAMUSCULAR | Status: AC
  Filled 2022-09-27: qty 1

## 2022-09-27 MED ORDER — IBUPROFEN 200 MG PO TABS
600.0000 mg | ORAL_TABLET | Freq: Four times a day (QID) | ORAL | Status: DC
  Administered 2022-09-27 – 2022-09-28 (×3): 600 mg via ORAL

## 2022-09-27 MED ORDER — PROPOFOL 500 MG/50ML IV EMUL
INTRAVENOUS | Status: DC | PRN
  Administered 2022-09-27: 25 ug/kg/min via INTRAVENOUS

## 2022-09-27 MED ORDER — LACTATED RINGERS IV SOLN: INTRAVENOUS | Status: DC

## 2022-09-27 MED ORDER — IBUPROFEN 200 MG PO TABS
ORAL_TABLET | ORAL | Status: AC
  Filled 2022-09-27: qty 3

## 2022-09-27 MED ORDER — FENTANYL CITRATE (PF) 100 MCG/2ML IJ SOLN
INTRAMUSCULAR | Status: AC
  Filled 2022-09-27: qty 2

## 2022-09-27 MED ORDER — OXYCODONE HCL 5 MG/5ML PO SOLN: 5.0000 mg | Freq: Once | ORAL | Status: AC | PRN

## 2022-09-27 MED ORDER — ONDANSETRON HCL 4 MG PO TABS: 4.0000 mg | ORAL_TABLET | Freq: Four times a day (QID) | ORAL | Status: DC | PRN

## 2022-09-27 MED ORDER — IBUPROFEN 800 MG PO TABS
800.0000 mg | ORAL_TABLET | Freq: Three times a day (TID) | ORAL | 1 refills | Status: AC | PRN
  Filled 2022-09-27: qty 30, 10d supply, fill #0

## 2022-09-27 MED ORDER — PROPOFOL 10 MG/ML IV BOLUS
INTRAVENOUS | Status: AC
  Filled 2022-09-27: qty 20

## 2022-09-27 MED ORDER — DOCUSATE SODIUM 100 MG PO CAPS
ORAL_CAPSULE | ORAL | Status: AC
  Filled 2022-09-27: qty 1

## 2022-09-27 MED ORDER — CEFAZOLIN SODIUM-DEXTROSE 2-4 GM/100ML-% IV SOLN
2.0000 g | INTRAVENOUS | Status: AC
  Administered 2022-09-27: 2 g via INTRAVENOUS

## 2022-09-27 MED ORDER — MIDAZOLAM HCL 2 MG/2ML IJ SOLN
INTRAMUSCULAR | Status: AC
  Filled 2022-09-27: qty 2

## 2022-09-27 MED ORDER — DOCUSATE SODIUM 100 MG PO CAPS
100.0000 mg | ORAL_CAPSULE | Freq: Two times a day (BID) | ORAL | Status: DC
  Administered 2022-09-27: 100 mg via ORAL

## 2022-09-27 MED ORDER — LIDOCAINE HCL (PF) 2 % IJ SOLN
INTRAMUSCULAR | Status: AC
  Filled 2022-09-27: qty 5

## 2022-09-27 MED ORDER — OXYCODONE HCL 5 MG PO TABS
ORAL_TABLET | ORAL | Status: AC
  Filled 2022-09-27: qty 2

## 2022-09-27 MED ORDER — ONDANSETRON HCL 4 MG/2ML IJ SOLN
INTRAMUSCULAR | Status: DC | PRN
  Administered 2022-09-27: 4 mg via INTRAVENOUS

## 2022-09-27 MED ORDER — FENTANYL CITRATE (PF) 250 MCG/5ML IJ SOLN
INTRAMUSCULAR | Status: AC
  Filled 2022-09-27: qty 5

## 2022-09-27 MED ORDER — ACETAMINOPHEN 500 MG PO TABS
1000.0000 mg | ORAL_TABLET | Freq: Four times a day (QID) | ORAL | Status: DC
  Administered 2022-09-27 – 2022-09-28 (×3): 1000 mg via ORAL

## 2022-09-27 MED ORDER — ONDANSETRON HCL 4 MG/2ML IJ SOLN: 4.0000 mg | Freq: Once | INTRAMUSCULAR | Status: DC | PRN

## 2022-09-27 MED ORDER — OXYCODONE HCL 5 MG PO TABS
5.0000 mg | ORAL_TABLET | Freq: Four times a day (QID) | ORAL | Status: DC | PRN
  Administered 2022-09-27: 5 mg via ORAL
  Administered 2022-09-27 – 2022-09-28 (×2): 10 mg via ORAL

## 2022-09-27 MED ORDER — PROPOFOL 10 MG/ML IV BOLUS
INTRAVENOUS | Status: DC | PRN
  Administered 2022-09-27: 200 mg via INTRAVENOUS

## 2022-09-27 MED ORDER — MIDAZOLAM HCL 5 MG/5ML IJ SOLN
INTRAMUSCULAR | Status: DC | PRN
  Administered 2022-09-27: 2 mg via INTRAVENOUS

## 2022-09-27 MED ORDER — 0.9 % SODIUM CHLORIDE (POUR BTL) OPTIME
TOPICAL | Status: DC | PRN
  Administered 2022-09-27: 1000 mL

## 2022-09-27 MED ORDER — SUGAMMADEX SODIUM 200 MG/2ML IV SOLN
INTRAVENOUS | Status: DC | PRN
  Administered 2022-09-27: 200 mg via INTRAVENOUS

## 2022-09-27 MED ORDER — ACETAMINOPHEN 500 MG PO TABS
1000.0000 mg | ORAL_TABLET | Freq: Once | ORAL | Status: AC
  Administered 2022-09-27: 1000 mg via ORAL

## 2022-09-27 MED ORDER — FENTANYL CITRATE (PF) 100 MCG/2ML IJ SOLN
25.0000 ug | INTRAMUSCULAR | Status: DC | PRN
  Administered 2022-09-27: 50 ug via INTRAVENOUS

## 2022-09-27 MED ORDER — CEFAZOLIN SODIUM-DEXTROSE 2-4 GM/100ML-% IV SOLN
INTRAVENOUS | Status: AC
  Filled 2022-09-27: qty 100

## 2022-09-27 SURGICAL SUPPLY — 29 items
COVER MAYO STAND STRL (DRAPES) ×1 IMPLANT
DRAPE STERI URO 9X17 APER PCH (DRAPES) ×1 IMPLANT
GAUZE 4X4 16PLY ~~LOC~~+RFID DBL (SPONGE) ×1 IMPLANT
GLOVE BIOGEL M 6.5 STRL (GLOVE) ×2 IMPLANT
GLOVE BIOGEL PI IND STRL 6.5 (GLOVE) ×1 IMPLANT
GLOVE BIOGEL PI IND STRL 7.0 (GLOVE) ×1 IMPLANT
GOWN STRL REUS W/ TWL LRG LVL3 (GOWN DISPOSABLE) ×2 IMPLANT
GOWN STRL REUS W/TWL LRG LVL3 (GOWN DISPOSABLE) ×3
HEMOSTAT ARISTA ABSORB 3G PWDR (HEMOSTASIS) IMPLANT
KIT TURNOVER CYSTO (KITS) ×1 IMPLANT
LIGASURE IMPACT 36 18CM CVD LR (INSTRUMENTS) IMPLANT
NS IRRIG 1000ML POUR BTL (IV SOLUTION) ×1 IMPLANT
PACK VAGINAL WOMENS (CUSTOM PROCEDURE TRAY) ×1 IMPLANT
PAD OB MATERNITY 4.3X12.25 (PERSONAL CARE ITEMS) ×1 IMPLANT
SET CYSTO W/LG BORE CLAMP LF (SET/KITS/TRAYS/PACK) IMPLANT
SET IRRIG Y TYPE TUR BLADDER L (SET/KITS/TRAYS/PACK) IMPLANT
SPIKE FLUID TRANSFER (MISCELLANEOUS) IMPLANT
SUT CHROMIC 2 0 CT 1 (SUTURE) IMPLANT
SUT VIC AB 0 CT1 27 (SUTURE) ×2
SUT VIC AB 0 CT1 27XCR 8 STRN (SUTURE) ×2 IMPLANT
SUT VIC AB 0 CT1 36 (SUTURE) IMPLANT
SUT VIC AB 2-0 CT1 (SUTURE) IMPLANT
SUT VIC AB 2-0 SH 27 (SUTURE)
SUT VIC AB 2-0 SH 27XBRD (SUTURE) IMPLANT
SUT VICRYL 1 TIES 12X18 (SUTURE) IMPLANT
SYR BULB IRRIG 60ML STRL (SYRINGE) ×1 IMPLANT
TOWEL OR 17X26 10 PK STRL BLUE (TOWEL DISPOSABLE) ×1 IMPLANT
TRAY FOLEY W/BAG SLVR 14FR (SET/KITS/TRAYS/PACK) ×1 IMPLANT
UNDERPAD 30X36 HEAVY ABSORB (UNDERPADS AND DIAPERS) ×1 IMPLANT

## 2022-09-27 NOTE — Transfer of Care (Signed)
Immediate Anesthesia Transfer of Care Note  Patient: Rebecca Vance  Procedure(s) Performed: Procedure(s) (LRB): TOTAL VAGINAL HYSTERECTOMY WITH BILATERAL SALPINGECTOMY (Bilateral)  Patient Location: PACU  Anesthesia Type: General  Level of Consciousness: awake, oriented, sedated and patient cooperative  Airway & Oxygen Therapy: Patient Spontanous Breathing and Patient connected to face mask oxygen  Post-op Assessment: Report given to PACU RN and Post -op Vital signs reviewed and stable  Post vital signs: Reviewed and stable  Complications: No apparent anesthesia complications  Last Vitals:  Vitals Value Taken Time  BP 102/67 09/27/22 1545  Temp 36.3 C 09/27/22 1500  Pulse 66 09/27/22 1546  Resp 13 09/27/22 1546  SpO2 96 % 09/27/22 1546  Vitals shown include unvalidated device data.  Last Pain:  Vitals:   09/27/22 1530  TempSrc:   PainSc: 7       Patients Stated Pain Goal: 4 (56/81/27 5170)  Complications: No notable events documented.

## 2022-09-27 NOTE — Interval H&P Note (Signed)
History and Physical Interval Note:  09/27/2022 12:53 PM  Rebecca Vance  has presented today for surgery, with the diagnosis of Abnormal Uterine Bleeding  Iron Definciency Anemia, levels greater than 8..  The various methods of treatment have been discussed with the patient and family. After consideration of risks, benefits and other options for treatment, the patient has consented to  Procedure(s): TOTAL VAGINAL HYSTERECTOMY WITH BILATERAL SALPINGECTOMY (Bilateral) as a surgical intervention.  The patient's history has been reviewed, patient examined, no change in status, stable for surgery.  I have reviewed the patient's chart and labs.  Questions were answered to the patient's satisfaction.     Drema Dallas

## 2022-09-27 NOTE — Progress Notes (Signed)
Patient resting.

## 2022-09-27 NOTE — Anesthesia Procedure Notes (Signed)
Procedure Name: Intubation Date/Time: 09/27/2022 1:02 PM  Performed by: Maryella Shivers, CRNAPre-anesthesia Checklist: Patient identified, Emergency Drugs available, Suction available and Patient being monitored Patient Re-evaluated:Patient Re-evaluated prior to induction Oxygen Delivery Method: Circle system utilized Preoxygenation: Pre-oxygenation with 100% oxygen Induction Type: IV induction Ventilation: Mask ventilation without difficulty Laryngoscope Size: Mac and 4 Grade View: Grade I Tube type: Oral Tube size: 7.5 mm Number of attempts: 1 Airway Equipment and Method: Stylet and Oral airway Placement Confirmation: ETT inserted through vocal cords under direct vision, positive ETCO2 and breath sounds checked- equal and bilateral Secured at: 21 cm Tube secured with: Tape Dental Injury: Teeth and Oropharynx as per pre-operative assessment

## 2022-09-27 NOTE — Anesthesia Preprocedure Evaluation (Signed)
Anesthesia Evaluation  Patient identified by MRN, date of birth, ID band Patient awake    Reviewed: Allergy & Precautions, NPO status , Patient's Chart, lab work & pertinent test results  History of Anesthesia Complications Negative for: history of anesthetic complications  Airway Mallampati: II  TM Distance: >3 FB Neck ROM: Full    Dental  (+) Dental Advisory Given, Teeth Intact   Pulmonary neg pulmonary ROS   Pulmonary exam normal        Cardiovascular negative cardio ROS Normal cardiovascular exam     Neuro/Psych negative neurological ROS     GI/Hepatic negative GI ROS, Neg liver ROS,,,  Endo/Other  negative endocrine ROS    Renal/GU negative Renal ROS  negative genitourinary   Musculoskeletal negative musculoskeletal ROS (+)    Abdominal   Peds  Hematology  (+) Blood dyscrasia, anemia Hgb 11.4   Anesthesia Other Findings   Reproductive/Obstetrics Abnormal Uterine Bleeding                              Anesthesia Physical Anesthesia Plan  ASA: 2  Anesthesia Plan: General   Post-op Pain Management: Tylenol PO (pre-op)* and Toradol IV (intra-op)*   Induction: Intravenous  PONV Risk Score and Plan: 4 or greater and Ondansetron, Dexamethasone, Treatment may vary due to age or medical condition and Midazolam  Airway Management Planned: Oral ETT  Additional Equipment: None  Intra-op Plan:   Post-operative Plan: Extubation in OR  Informed Consent: I have reviewed the patients History and Physical, chart, labs and discussed the procedure including the risks, benefits and alternatives for the proposed anesthesia with the patient or authorized representative who has indicated his/her understanding and acceptance.     Dental advisory given  Plan Discussed with:   Anesthesia Plan Comments:          Anesthesia Quick Evaluation

## 2022-09-27 NOTE — Op Note (Signed)
Pre Op Dx:   1. Abnormal uterine bleeding 2. Iron deficiency anemia  Post Op Dx:  Same as pre-operative diagnoses  Procedure:   Total Vaginal hysterectomy with bilateral salpingectomy  Surgeon:  Dr. Wellington Hampshire. Delora Fuel Assistants:  Dr. Christophe Louis Anesthesia:  General  EBL:  200cc  IVF:  1500cc UOP:  100cc clear yellow urine  Drains:  Foley catheter -- removed at end of case Specimen removed:  Uterus, cervix, and bilateral fallopian tubes -- sent to pathology Device(s) implanted:  No Case Type:  Clean-contaminated Findings: Normal-appearing multiparous cervix, mildly enlarged but normal-appearing uterus. Normal-appearing bilateral fallopian tubes and ovaries. Complications: None Indications:  41 y.o. G2P2 with AUB and iron deficiency anemia who desires definitive surgical management.  Description of procedure: After informed consent was obtained the patient was brought to the operating room.  She was placed in dorsal supine position and anesthesia was administered.  She was placed in dorsal lithotomy position and prepped and draped in the usual sterile fashion.  A Foley catheter was placed.  A preoperative timeout was completed.  The cervix was grasped and the vaginal epithelium injected with 1% Lidocaine with epinephrine.  A circumferential colpotomy was created using Bovie electrosurgery.  The epithelium was divided further with scissors and blunt dissection. The anterior vesicouterine peritoneal reflection was identified and opened sharply. The Deaver retractor was placed.   The posterior cul-de-sac was entered sharply.  The uterosacral ligaments were divided and suture ligated with 0 Vicryl.  The Steiner weighted retractor was placed.   The cardinal ligaments with the uterine vasculature were divided and suture ligated.  Sequential pedicles of the broad ligament were isolated and divided using the Ligasure.  Due to the width of the uterus, attempted to visualize the pedicles by delivering  the fundus posteriorly but this did not allow adequate visualization. The uterus was then bi-valved and the cornual pedicles containing the uteroovarian anastomosis, fallopian tube, and round ligament were each divided using the Ligasure.  The uterus was removed and passed off the field.  The fallopian tubes were elevated and dissected free of the mesosalpinx using the Ligasure. Both tubes were removed.  The ovaries appeared normal. The uterosacral ligaments were plicated to their ipsilateral vaginal cuff. The vaginal cuff was then closed including the peritoneum in a running-lock fashion using 0-Vicryl.  Irrigation was performed and hemostasis verified.  She was returned to dorsal supine position, awakened and extubated having appeared to tolerate the procedure well.  All counts were correct.  She was transferred to PACU in good condition.   Disposition:  PACU  Drema Dallas, DO

## 2022-09-27 NOTE — Anesthesia Postprocedure Evaluation (Signed)
Anesthesia Post Note  Patient: Rebecca Vance  Procedure(s) Performed: TOTAL VAGINAL HYSTERECTOMY WITH BILATERAL SALPINGECTOMY (Bilateral: Vagina )     Patient location during evaluation: PACU Anesthesia Type: General Level of consciousness: awake and alert Pain management: pain level controlled Vital Signs Assessment: post-procedure vital signs reviewed and stable Respiratory status: spontaneous breathing, nonlabored ventilation and respiratory function stable Cardiovascular status: blood pressure returned to baseline and stable Postop Assessment: no apparent nausea or vomiting Anesthetic complications: no   No notable events documented.  Last Vitals:  Vitals:   09/27/22 1530 09/27/22 1545  BP: 116/75 102/67  Pulse: 76 64  Resp: 14 13  Temp:    SpO2: 100% 96%    Last Pain:  Vitals:   09/27/22 1530  TempSrc:   PainSc: 7                  Lidia Collum

## 2022-09-27 NOTE — Discharge Summary (Signed)
Physician Discharge Summary  Patient ID: Rebecca Vance MRN: [id number] DOB/AGE: [birth year] 41 y.o.  Admit date: 09/27/2022 Discharge date: 09/28/2022  Admission Diagnoses: Abnormal uterine bleeding (AUB) Iron deficiency anemia  Discharge Diagnoses:  Principal Problem:   Abnormal uterine bleeding (AUB) Iron deficiency anemia  Procedure(s): Total Vaginal Hysterectomy and Bilateral Salpingectomy  Discharged Condition: good  Hospital Course: Patient was admitted on 09/27/2022 for the above named procedure(s) for the above named diagnoses. Prior to hospital discharge, patient was tolerating PO, ambulating, voiding spontaneously, passing flatus, and pain was well-controlled. See hospital chart for specific details. Patient was discharged home in stable condition.  Consults: None  Significant Diagnostic Studies: None  Treatments: surgery: As documented above  Discharge Exam: Blood pressure 97/62, pulse (!) 59, temperature 97.8 F (36.6 C), resp. rate 12, height '5\' 11"'$  (1.803 m), weight 107.2 kg, last menstrual period 09/18/2022, SpO2 95 %. Gen: NAD Resp: Normal work of breathing Abd: Soft, non-distended, non-tender Ext: No bilateral LE edema, SCDs on and working  Disposition: Discharge disposition: 01-Home or Self Care       Discharge Instructions     Call MD for:  difficulty breathing, headache or visual disturbances   Complete by: As directed    Call MD for:  extreme fatigue   Complete by: As directed    Call MD for:  hives   Complete by: As directed    Call MD for:  persistant dizziness or light-headedness   Complete by: As directed    Call MD for:  persistant nausea and vomiting   Complete by: As directed    Call MD for:  severe uncontrolled pain   Complete by: As directed    Call MD for:  temperature >100.4   Complete by: As directed    Diet - low sodium heart healthy   Complete by: As directed    Increase activity slowly   Complete by: As directed     Lifting restrictions   Complete by: As directed    No lifting anything heavier than 15lbs   Sexual Activity Restrictions   Complete by: As directed    No sexual intercourse or objects in the vagina for at least 6 weeks.      Allergies as of 09/28/2022       Reactions   Hydrocodone-acetaminophen Rash, Other (See Comments)   headache        Medication List     TAKE these medications    ibuprofen 800 MG tablet Commonly known as: ADVIL Take 1 tablet (800 mg total) by mouth every 8 (eight) hours as needed for moderate pain, cramping or mild pain. What changed: See the new instructions.   Lysteda 650 MG Tabs tablet Generic drug: tranexamic acid During menstruation   ondansetron 4 MG tablet Commonly known as: Zofran Take 1 tablet (4 mg total) by mouth every 8 (eight) hours as needed for nausea or vomiting.   oxyCODONE 5 MG immediate release tablet Commonly known as: Oxy IR/ROXICODONE Take 1 tablet (5 mg total) by mouth every 6 (six) hours as needed for severe pain or breakthrough pain. Patient is a transitions of care patient and will be discharging today -- needs medication delivered. Thank you!   VITRON-C PO Take by mouth. Takes 2 or 3 times weekly per pt on 09/18/22.   ZINC GLUCONATE PO Take 2 tablets by mouth daily.        Follow-up Information     Drema Dallas, DO Follow up in 2 week(s).  Specialty: Obstetrics and Gynecology Why: Please keep your 2 week post-operative visit as previously scheduled. Contact information: [address] [place] [address] [phone]                 Signed: Drema Dallas 09/28/2022, 7:21 AM

## 2022-09-27 NOTE — Progress Notes (Signed)
Patient ambulated well to the bathroom.  Family at bedside.  V.S. WNL.  Abdomen is soft. Peri pad changed with scant amount of blood.

## 2022-09-27 NOTE — Progress Notes (Signed)
Patient laying on left side family at bedside.

## 2022-09-28 ENCOUNTER — Other Ambulatory Visit (HOSPITAL_COMMUNITY): Payer: Self-pay

## 2022-09-28 DIAGNOSIS — D251 Intramural leiomyoma of uterus: Secondary | ICD-10-CM | POA: Diagnosis not present

## 2022-09-28 DIAGNOSIS — N72 Inflammatory disease of cervix uteri: Secondary | ICD-10-CM | POA: Diagnosis not present

## 2022-09-28 DIAGNOSIS — N939 Abnormal uterine and vaginal bleeding, unspecified: Secondary | ICD-10-CM | POA: Diagnosis not present

## 2022-09-28 DIAGNOSIS — D509 Iron deficiency anemia, unspecified: Secondary | ICD-10-CM | POA: Diagnosis not present

## 2022-09-28 DIAGNOSIS — N840 Polyp of corpus uteri: Secondary | ICD-10-CM | POA: Diagnosis not present

## 2022-09-28 MED ORDER — ACETAMINOPHEN 500 MG PO TABS
ORAL_TABLET | ORAL | Status: AC
  Filled 2022-09-28: qty 2

## 2022-09-28 MED ORDER — IBUPROFEN 200 MG PO TABS
ORAL_TABLET | ORAL | Status: AC
  Filled 2022-09-28: qty 3

## 2022-09-28 MED ORDER — ONDANSETRON HCL 4 MG/2ML IJ SOLN
INTRAMUSCULAR | Status: AC
  Filled 2022-09-28: qty 2

## 2022-09-28 MED ORDER — ONDANSETRON HCL 4 MG PO TABS
4.0000 mg | ORAL_TABLET | Freq: Three times a day (TID) | ORAL | 0 refills | Status: AC | PRN
  Filled 2022-09-28: qty 20, 7d supply, fill #0

## 2022-09-28 MED ORDER — OXYCODONE HCL 5 MG PO TABS
ORAL_TABLET | ORAL | Status: AC
  Filled 2022-09-28: qty 2

## 2022-09-29 ENCOUNTER — Encounter (HOSPITAL_BASED_OUTPATIENT_CLINIC_OR_DEPARTMENT_OTHER): Payer: Self-pay | Admitting: Obstetrics and Gynecology

## 2022-09-30 LAB — SURGICAL PATHOLOGY

## 2022-11-10 DIAGNOSIS — Z4889 Encounter for other specified surgical aftercare: Secondary | ICD-10-CM | POA: Diagnosis not present

## 2022-11-16 ENCOUNTER — Telehealth: Payer: Self-pay | Admitting: Hematology and Oncology

## 2022-11-16 NOTE — Telephone Encounter (Signed)
Called patient to r/s 1/30 appointment due to provider PAL. Left voicemail with new appointment information.

## 2022-12-12 ENCOUNTER — Ambulatory Visit: Payer: BC Managed Care – PPO | Admitting: Hematology and Oncology

## 2022-12-12 ENCOUNTER — Other Ambulatory Visit: Payer: BC Managed Care – PPO

## 2022-12-21 ENCOUNTER — Inpatient Hospital Stay: Payer: BC Managed Care – PPO | Attending: Hematology and Oncology

## 2022-12-21 ENCOUNTER — Inpatient Hospital Stay (HOSPITAL_BASED_OUTPATIENT_CLINIC_OR_DEPARTMENT_OTHER): Payer: BC Managed Care – PPO | Admitting: Hematology and Oncology

## 2022-12-21 ENCOUNTER — Other Ambulatory Visit: Payer: Self-pay

## 2022-12-21 VITALS — BP 111/73 | HR 61 | Temp 98.2°F | Resp 13 | Wt 238.6 lb

## 2022-12-21 DIAGNOSIS — N920 Excessive and frequent menstruation with regular cycle: Secondary | ICD-10-CM | POA: Diagnosis not present

## 2022-12-21 DIAGNOSIS — D500 Iron deficiency anemia secondary to blood loss (chronic): Secondary | ICD-10-CM | POA: Insufficient documentation

## 2022-12-21 LAB — CBC WITH DIFFERENTIAL (CANCER CENTER ONLY)
Abs Immature Granulocytes: 0.01 10*3/uL (ref 0.00–0.07)
Basophils Absolute: 0.1 10*3/uL (ref 0.0–0.1)
Basophils Relative: 1 %
Eosinophils Absolute: 0.1 10*3/uL (ref 0.0–0.5)
Eosinophils Relative: 1 %
HCT: 38.5 % (ref 36.0–46.0)
Hemoglobin: 12.6 g/dL (ref 12.0–15.0)
Immature Granulocytes: 0 %
Lymphocytes Relative: 40 %
Lymphs Abs: 1.7 10*3/uL (ref 0.7–4.0)
MCH: 26.9 pg (ref 26.0–34.0)
MCHC: 32.7 g/dL (ref 30.0–36.0)
MCV: 82.1 fL (ref 80.0–100.0)
Monocytes Absolute: 0.4 10*3/uL (ref 0.1–1.0)
Monocytes Relative: 10 %
Neutro Abs: 2 10*3/uL (ref 1.7–7.7)
Neutrophils Relative %: 48 %
Platelet Count: 231 10*3/uL (ref 150–400)
RBC: 4.69 MIL/uL (ref 3.87–5.11)
RDW: 14.1 % (ref 11.5–15.5)
WBC Count: 4.3 10*3/uL (ref 4.0–10.5)
nRBC: 0 % (ref 0.0–0.2)

## 2022-12-21 LAB — CMP (CANCER CENTER ONLY)
ALT: 10 U/L (ref 0–44)
AST: 14 U/L — ABNORMAL LOW (ref 15–41)
Albumin: 4.1 g/dL (ref 3.5–5.0)
Alkaline Phosphatase: 58 U/L (ref 38–126)
Anion gap: 6 (ref 5–15)
BUN: 8 mg/dL (ref 6–20)
CO2: 25 mmol/L (ref 22–32)
Calcium: 9.6 mg/dL (ref 8.9–10.3)
Chloride: 105 mmol/L (ref 98–111)
Creatinine: 0.78 mg/dL (ref 0.44–1.00)
GFR, Estimated: >60 mL/min (ref >60)
Glucose, Bld: 81 mg/dL (ref 70–99)
Potassium: 4.3 mmol/L (ref 3.5–5.1)
Sodium: 136 mmol/L (ref 135–145)
Total Bilirubin: 0.3 mg/dL (ref 0.3–1.2)
Total Protein: 7.4 g/dL (ref 6.5–8.1)

## 2022-12-21 LAB — IRON AND IRON BINDING CAPACITY (CC-WL,HP ONLY)
Iron: 41 ug/dL (ref 28–170)
Saturation Ratios: 9 % — ABNORMAL LOW (ref 10.4–31.8)
TIBC: 434 ug/dL (ref 250–450)
UIBC: 393 ug/dL (ref 148–442)

## 2022-12-21 LAB — RETIC PANEL
Immature Retic Fract: 8.8 % (ref 2.3–15.9)
RBC.: 4.68 MIL/uL (ref 3.87–5.11)
Retic Count, Absolute: 37 10*3/uL (ref 19.0–186.0)
Retic Ct Pct: 0.8 % (ref 0.4–3.1)
Reticulocyte Hemoglobin: 30 pg (ref >27.9)

## 2022-12-21 LAB — FERRITIN: Ferritin: 9 ng/mL — ABNORMAL LOW (ref 11–307)

## 2022-12-21 NOTE — Progress Notes (Signed)
[place] [phone]   [phone]  PROGRESS NOTE  Patient Care Team: Lennie Odor, PA as PCP - General (Nurse Practitioner)  Hematological/Oncological History # Iron Deficiency Anemia 2/2 to GYN Bleeding 03/27/2022: WBC 3.8, Hgb 8.6, MCV 73.2, Plt 283.TIBC 496, Iron sat 4%, ferritin 4.9.   04/12/2022: establish care with Dr. Lorenso Courier 6/6 - 04/28/2022: IV iron sucrose 200 mg x 5 doses.  06/09/2022: WBC 3.2, Hgb 11.8, MCV 83.1, Plt 203 09/27/2022: total vaginal hysterectomy with bilateral salpingectomy   Interval History:  Rebecca Vance 42 y.o. female with medical history significant for iron deficiency anemia secondary to GYN bleeding who presents for a follow up visit. The patient's last visit was on 06/09/2022. In the interim since the last visit she has had no major changes in her health.   On exam today Ms. Rebecca Vance reports her new years off to good start.  She reports her energy is approximately a 6 out of 10.  She is not having any lightheadedness, dizziness, or shortness of breath.  She is doing her best to try to eat iron rich foods including spinach.  She is avoiding red meat.  She reports she has not had any menstrual cycles since she had her hysterectomy in November.  She has had no nosebleeds, gum bleeding, or bleeding elsewhere.  She reports that she is not currently taking any iron supplements as they upset her stomach.  She reports that she made a good recovery from her surgery.  Overall she feels well and has no questions concerns or complaints today.  She otherwise denies any fevers, chills, sweats, nausea, vomiting or diarrhea.  A full 10 point ROS is listed below.  MEDICAL HISTORY:  Past Medical History:  Diagnosis Date   Anemia    iron deficiency anemia, follows with Dr. Narda Rutherford @ [facility], lov 06/09/22, s/p IV iron infusions x 5  doses 04/18/22 - 04/28/22   COVID-19 07/28/2022   congestion, cough, loss of taste  & smell, took OTC meds   Headache    occasional , treats w/ OTC meds    SURGICAL HISTORY: Past Surgical History:  Procedure Laterality Date   TONSILLECTOMY AND ADENOIDECTOMY     as a child   VAGINAL HYSTERECTOMY Bilateral 09/27/2022   Procedure: TOTAL VAGINAL HYSTERECTOMY WITH BILATERAL SALPINGECTOMY;  Surgeon: Drema Dallas, DO;  Location: [facility];  Service: Gynecology;  Laterality: Bilateral;    SOCIAL HISTORY: Social History   Socioeconomic History   Marital status: Single    Spouse name: Not on file   Number of children: Not on file   Years of education: Not on file   Highest education level: Not on file  Occupational History   Not on file  Tobacco Use   Smoking status: Never   Smokeless tobacco: Never  Vaping Use   Vaping Use: Never used  Substance and Sexual Activity   Alcohol use: Yes    Alcohol/week: 2.0 standard drinks of alcohol    Types: 2 Glasses of wine per week   Drug use: No   Sexual activity: Yes    Birth control/protection: None  Other Topics Concern   Not on file  Social History Narrative   Not on file   Social Determinants of Health   Financial Resource Strain: Not on file  Food Insecurity: Not on file  Transportation Needs: Not on file  Physical Activity: Not on file  Stress: Not on file  Social Connections:  Not on file  Intimate Partner Violence: Not on file    FAMILY HISTORY: Family History  Problem Relation Age of Onset   Hypertension Mother    Diabetes Father    Breast cancer Neg Hx     ALLERGIES:  is allergic to hydrocodone-acetaminophen.  MEDICATIONS:  Current Outpatient Medications  Medication Sig Dispense Refill   ibuprofen (ADVIL) 800 MG tablet Take 1 tablet (800 mg total) by mouth every 8 (eight) hours as needed for moderate pain, cramping or mild pain. 30 tablet 1   Iron-Vitamin C (VITRON-C PO) Take by mouth. Takes 2 or 3 times weekly per pt on 09/18/22.     ondansetron (ZOFRAN) 4 MG tablet Take 1  tablet (4 mg total) by mouth every 8 (eight) hours as needed for nausea or vomiting. 20 tablet 0   oxyCODONE (OXY IR/ROXICODONE) 5 MG immediate release tablet Take 1 tablet (5 mg total) by mouth every 6 (six) hours as needed for severe pain or breakthrough pain. Patient is a transitions of care patient and will be discharging today -- needs medication delivered. Thank you! 20 tablet 0   tranexamic acid (LYSTEDA) 650 MG TABS tablet During menstruation     ZINC GLUCONATE PO Take 2 tablets by mouth daily.     No current facility-administered medications for this visit.    REVIEW OF SYSTEMS:   Constitutional: ( - ) fevers, ( - )  chills , ( - ) night sweats Eyes: ( - ) blurriness of vision, ( - ) double vision, ( - ) watery eyes Ears, nose, mouth, throat, and face: ( - ) mucositis, ( - ) sore throat Respiratory: ( - ) cough, ( - ) dyspnea, ( - ) wheezes Cardiovascular: ( - ) palpitation, ( - ) chest discomfort, ( - ) lower extremity swelling Gastrointestinal:  ( - ) nausea, ( - ) heartburn, ( - ) change in bowel habits Skin: ( - ) abnormal skin rashes Lymphatics: ( - ) new lymphadenopathy, ( - ) easy bruising Neurological: ( - ) numbness, ( - ) tingling, ( - ) new weaknesses Behavioral/Psych: ( - ) mood change, ( - ) new changes  All other systems were reviewed with the patient and are negative.  PHYSICAL EXAMINATION:  Vitals:   12/21/22 1400  BP: 111/73  Pulse: 61  Resp: 13  Temp: 98.2 F (36.8 C)  SpO2: 100%   Filed Weights   12/21/22 1400  Weight: 238 lb 9.6 oz (108.2 kg)    GENERAL: Well-appearing middle-aged African-American female, alert, no distress and comfortable SKIN: skin color, texture, turgor are normal, no rashes or significant lesions EYES: conjunctiva are pink and non-injected, sclera clear LUNGS: clear to auscultation and percussion with normal breathing effort HEART: regular rate & rhythm and no murmurs and no lower extremity edema Musculoskeletal: no cyanosis  of digits and no clubbing  PSYCH: alert & oriented x 3, fluent speech NEURO: no focal motor/sensory deficits  LABORATORY DATA:  I have reviewed the data as listed    Latest Ref Rng & Units 12/21/2022    1:42 PM 09/22/2022    9:00 AM 09/11/2022   11:10 AM  CBC  WBC 4.0 - 10.5 K/uL 4.3  3.8  4.3   Hemoglobin 12.0 - 15.0 g/dL 12.6  11.4  12.4   Hematocrit 36.0 - 46.0 % 38.5  36.5  37.3   Platelets 150 - 400 K/uL 231  224  230        Latest Ref  Rng & Units 12/21/2022    1:42 PM 09/11/2022   11:10 AM 06/09/2022   10:38 AM  CMP  Glucose 70 - 99 mg/dL 81  92  119   BUN 6 - 20 mg/dL '8  11  7   '$ Creatinine 0.44 - 1.00 mg/dL 0.78  0.80  0.86   Sodium 135 - 145 mmol/L 136  136  138   Potassium 3.5 - 5.1 mmol/L 4.3  4.1  4.1   Chloride 98 - 111 mmol/L 105  106  106   CO2 22 - 32 mmol/L '25  24  28   '$ Calcium 8.9 - 10.3 mg/dL 9.6  8.9  8.9   Total Protein 6.5 - 8.1 g/dL 7.4  8.1  7.5   Total Bilirubin 0.3 - 1.2 mg/dL 0.3  0.4  0.4   Alkaline Phos 38 - 126 U/L 58  53  48   AST 15 - 41 U/L '14  12  12   '$ ALT 0 - 44 U/L '10  10  9     '$ RADIOGRAPHIC STUDIES: No results found.  ASSESSMENT & PLAN Rebecca Vance 42 y.o. female with medical history significant for iron deficiency anemia secondary to GYN bleeding who presents for a follow up visit.  # Iron Deficiency Anemia 2/2 to GYN Bleeding -- Findings are consistent with iron deficiency anemia secondary to patient's menorrhagia --  Hysterectomy performed on 09/27/2022.  No further menstrual bleeding. -- Labs today show WBC 4.3, hemoglobin 12.6, MCV 82.1, and platelets of 231 -- Patient unable to tolerate p.o. iron therapy. -- Iron levels today currently pending.  If repeat dosing is required we will have this arranged --Plan to have the patient return on an as needed basis.   No orders of the defined types were placed in this encounter.   All questions were answered. The patient knows to call the clinic with any problems, questions or  concerns.  A total of more than 25 minutes were spent on this encounter with face-to-face time and non-face-to-face time, including preparing to see the patient, ordering tests and/or medications, counseling the patient and coordination of care as outlined above.   Ledell Peoples, MD Department of Hematology/Oncology [place] [facility] Phone: [phone] Pager: [phone] Email: [email or web address]  12/21/2022 4:56 PM

## 2023-06-15 DIAGNOSIS — E782 Mixed hyperlipidemia: Secondary | ICD-10-CM | POA: Diagnosis not present

## 2023-06-15 DIAGNOSIS — Z0000 Encounter for general adult medical examination without abnormal findings: Secondary | ICD-10-CM | POA: Diagnosis not present

## 2023-06-15 DIAGNOSIS — Z131 Encounter for screening for diabetes mellitus: Secondary | ICD-10-CM | POA: Diagnosis not present

## 2023-06-28 DIAGNOSIS — M25522 Pain in left elbow: Secondary | ICD-10-CM | POA: Diagnosis not present

## 2023-08-03 IMAGING — MG MM DIGITAL DIAGNOSTIC UNILAT*L* W/ TOMO W/ CAD
6 series · 6 of 18 positions shown · non-contrast
Comparison: 10/25/2021 baseline screening mammogram

CLINICAL DATA: 40-year-old female for further evaluation of
possible LEFT breast asymmetry on baseline screening mammogram.

EXAM:
DIGITAL DIAGNOSTIC UNILATERAL LEFT MAMMOGRAM WITH TOMOSYNTHESIS AND
CAD; ULTRASOUND LEFT BREAST LIMITED
TECHNIQUE: Left digital diagnostic mammography and breast tomosynthesis was
performed. The images were evaluated with computer-aided detection.;
Targeted ultrasound examination of the left breast was performed.

[L MLO synth-2D]
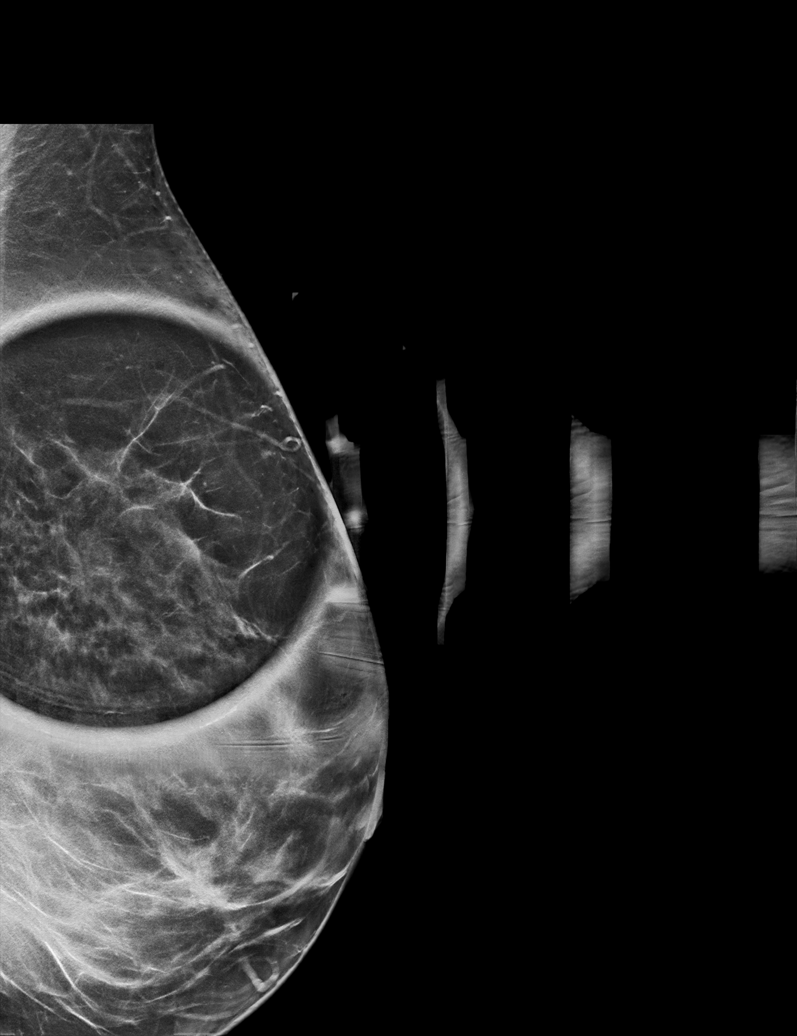

[L CC synth-2D]
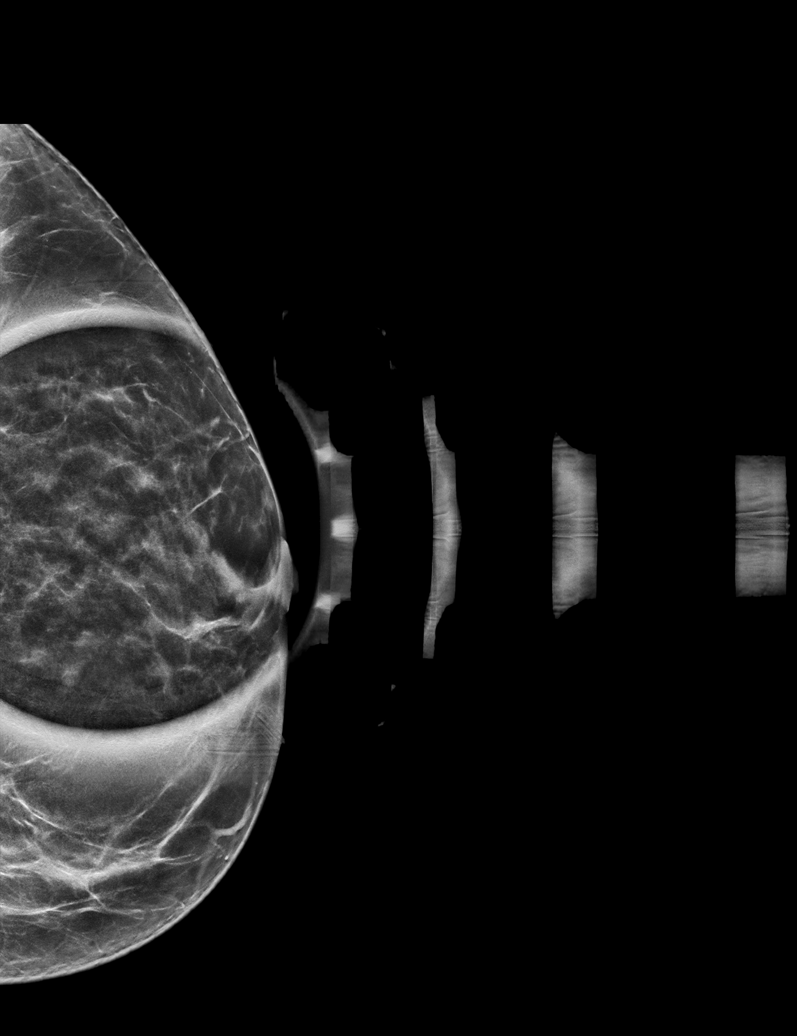

[L ML synth-2D]
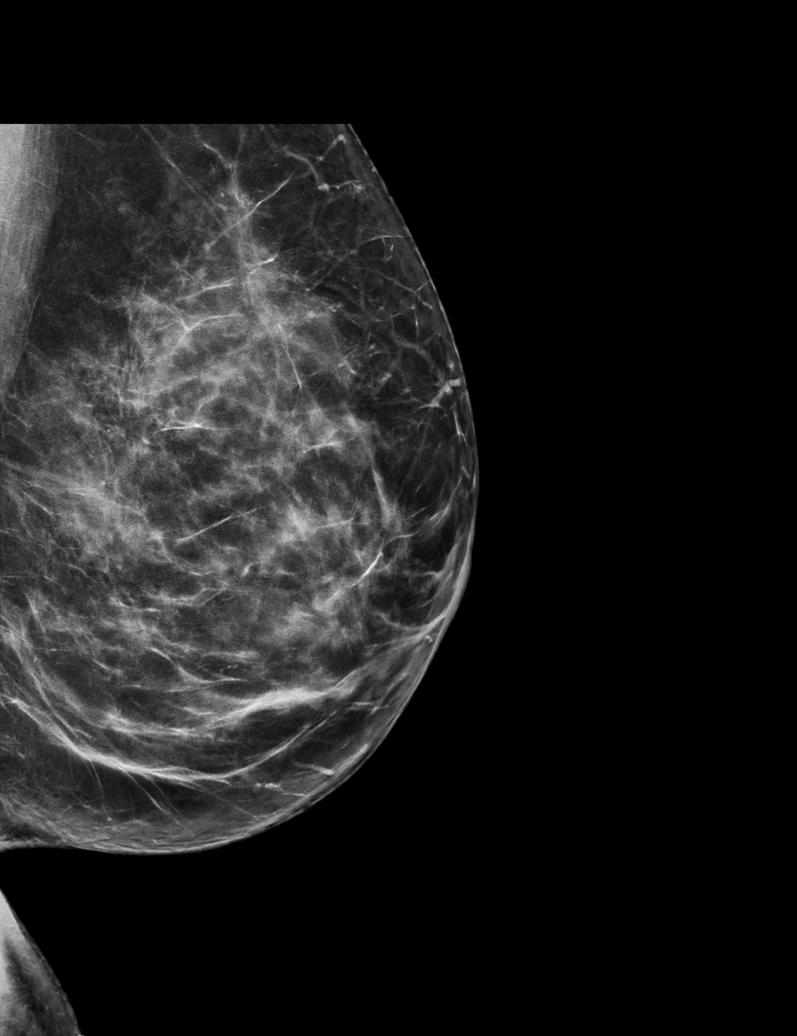

[L CC tomo · tomo slice 28/55.0]
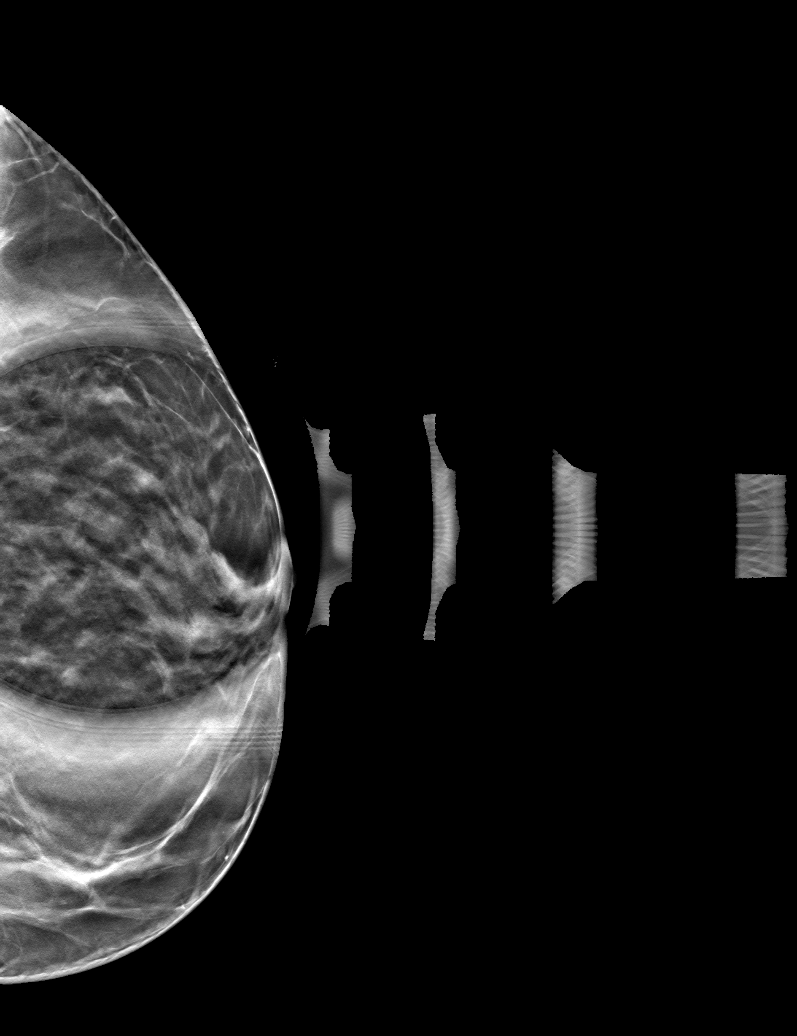

[L MLO tomo · tomo slice 35/69.0]
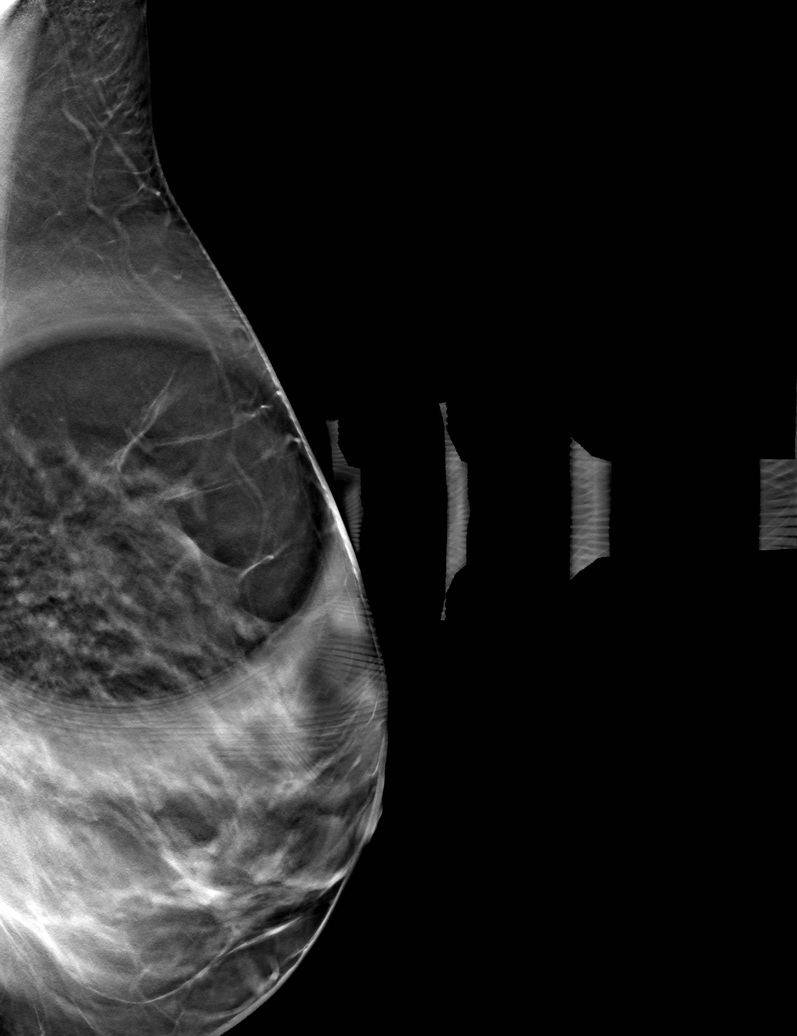

[L ML tomo · tomo slice 39/76.0]
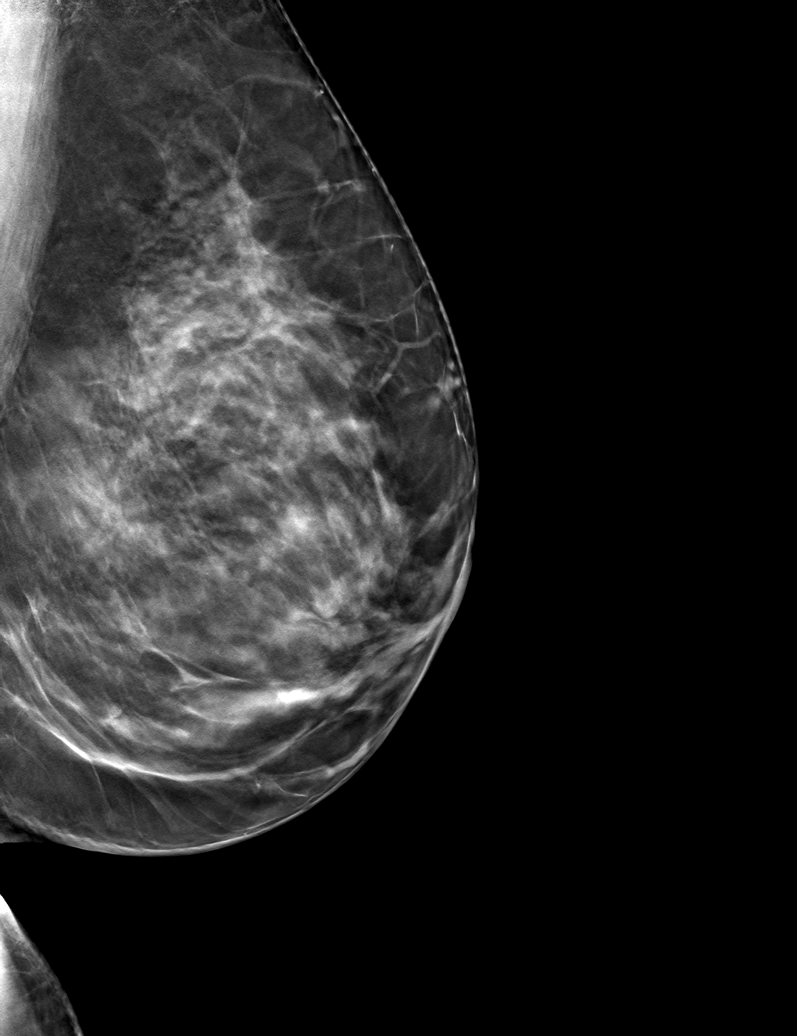

[6 of 18 positions shown; findings below may reference images not displayed]

ACR Breast Density Category c: The breast tissue is heterogeneously
dense, which may obscure small masses.
FINDINGS: 2D/3D full field and spot compression views of the LEFT breast
demonstrate dispersal of the asymmetry in the RETROAREOLAR LEFT
breast without persistent abnormality in this area.

No definite persistent abnormality is identified in the area of the
UPPER LEFT breast asymmetry but heterogeneously dense breast tissue
in this area is noted slightly limiting sensitivity.

Targeted ultrasound is performed, showing no sonographic
abnormalities within the entire UPPER LEFT breast.
IMPRESSION: 1. No persistent abnormalities in the areas of the LEFT breast
screening study findings.

RECOMMENDATION:
Bilateral screening mammogram in 1 year.

I have discussed the findings and recommendations with the patient.
If applicable, a reminder letter will be sent to the patient
regarding the next appointment.

BI-RADS CATEGORY  1: Negative.

## 2023-10-17 DIAGNOSIS — Z01419 Encounter for gynecological examination (general) (routine) without abnormal findings: Secondary | ICD-10-CM | POA: Diagnosis not present

## 2023-10-23 ENCOUNTER — Other Ambulatory Visit: Payer: Self-pay | Admitting: Physician Assistant

## 2023-10-23 DIAGNOSIS — Z1231 Encounter for screening mammogram for malignant neoplasm of breast: Secondary | ICD-10-CM

## 2023-11-22 ENCOUNTER — Ambulatory Visit
Admission: RE | Admit: 2023-11-22 | Discharge: 2023-11-22 | Disposition: A | Discharge status: Home / Self Care | Payer: BC Managed Care – PPO | Source: Ambulatory Visit | Attending: Physician Assistant | Admitting: Physician Assistant

## 2023-11-22 DIAGNOSIS — Z1231 Encounter for screening mammogram for malignant neoplasm of breast: Secondary | ICD-10-CM

## 2024-01-08 DIAGNOSIS — R635 Abnormal weight gain: Secondary | ICD-10-CM | POA: Diagnosis not present

## 2024-01-08 DIAGNOSIS — R0683 Snoring: Secondary | ICD-10-CM | POA: Diagnosis not present

## 2024-01-08 DIAGNOSIS — R0981 Nasal congestion: Secondary | ICD-10-CM | POA: Diagnosis not present

## 2024-01-08 DIAGNOSIS — R5383 Other fatigue: Secondary | ICD-10-CM | POA: Diagnosis not present

## 2024-03-10 DIAGNOSIS — E039 Hypothyroidism, unspecified: Secondary | ICD-10-CM | POA: Diagnosis not present

## 2024-03-10 DIAGNOSIS — L819 Disorder of pigmentation, unspecified: Secondary | ICD-10-CM | POA: Diagnosis not present

## 2024-05-07 DIAGNOSIS — E039 Hypothyroidism, unspecified: Secondary | ICD-10-CM | POA: Diagnosis not present

## 2024-05-30 DIAGNOSIS — N898 Other specified noninflammatory disorders of vagina: Secondary | ICD-10-CM | POA: Diagnosis not present

## 2024-06-16 DIAGNOSIS — Z131 Encounter for screening for diabetes mellitus: Secondary | ICD-10-CM | POA: Diagnosis not present

## 2024-06-16 DIAGNOSIS — Z0000 Encounter for general adult medical examination without abnormal findings: Secondary | ICD-10-CM | POA: Diagnosis not present

## 2024-06-16 DIAGNOSIS — E039 Hypothyroidism, unspecified: Secondary | ICD-10-CM | POA: Diagnosis not present

## 2024-10-08 ENCOUNTER — Other Ambulatory Visit: Payer: Self-pay | Admitting: Physician Assistant

## 2024-10-08 DIAGNOSIS — Z1231 Encounter for screening mammogram for malignant neoplasm of breast: Secondary | ICD-10-CM

## 2024-10-30 DIAGNOSIS — Z01419 Encounter for gynecological examination (general) (routine) without abnormal findings: Secondary | ICD-10-CM | POA: Diagnosis not present

## 2024-10-30 DIAGNOSIS — N898 Other specified noninflammatory disorders of vagina: Secondary | ICD-10-CM | POA: Diagnosis not present

## 2024-10-30 DIAGNOSIS — N951 Menopausal and female climacteric states: Secondary | ICD-10-CM | POA: Diagnosis not present

## 2024-11-24 ENCOUNTER — Ambulatory Visit
Admission: RE | Admit: 2024-11-24 | Discharge: 2024-11-24 | Disposition: A | Source: Ambulatory Visit | Attending: Physician Assistant | Admitting: Physician Assistant

## 2024-11-24 DIAGNOSIS — Z1231 Encounter for screening mammogram for malignant neoplasm of breast: Secondary | ICD-10-CM
